# Patient Record
Sex: Male | Born: 1965 | Race: White | Hispanic: No | Marital: Single | Smoking: Never smoker
Health system: Southern US, Community
[De-identification: ages and names within clinical notes are randomized; demographics above are authoritative.]

## PROBLEM LIST (undated history)

## (undated) DIAGNOSIS — E871 Hypo-osmolality and hyponatremia: Secondary | ICD-10-CM

## (undated) DIAGNOSIS — F79 Unspecified intellectual disabilities: Secondary | ICD-10-CM

## (undated) DIAGNOSIS — E119 Type 2 diabetes mellitus without complications: Secondary | ICD-10-CM

## (undated) DIAGNOSIS — I10 Essential (primary) hypertension: Secondary | ICD-10-CM

## (undated) HISTORY — PX: OTHER SURGICAL HISTORY: SHX169

---

## 2012-11-04 ENCOUNTER — Inpatient Hospital Stay: Payer: Self-pay | Admitting: Internal Medicine

## 2012-11-04 LAB — CBC
HCT: 30.5 % — ABNORMAL LOW (ref 40.0–52.0)
MCHC: 33.7 g/dL (ref 32.0–36.0)
MCV: 76 fL — ABNORMAL LOW (ref 80–100)
Platelet: 279 10*3/uL (ref 150–440)
WBC: 4.7 10*3/uL (ref 3.8–10.6)

## 2012-11-04 LAB — COMPREHENSIVE METABOLIC PANEL
Albumin: 3.5 g/dL (ref 3.4–5.0)
Alkaline Phosphatase: 125 U/L (ref 50–136)
Anion Gap: 9 (ref 7–16)
BUN: 3 mg/dL — ABNORMAL LOW (ref 7–18)
Calcium, Total: 8.2 mg/dL — ABNORMAL LOW (ref 8.5–10.1)
Chloride: 82 mmol/L — ABNORMAL LOW (ref 98–107)
Co2: 25 mmol/L (ref 21–32)
Creatinine: 0.6 mg/dL (ref 0.60–1.30)
EGFR (African American): >60
EGFR (Non-African Amer.): >60
Glucose: 204 mg/dL — ABNORMAL HIGH (ref 65–99)
Osmolality: 237 (ref 275–301)
SGPT (ALT): 30 U/L (ref 12–78)
Sodium: 116 mmol/L — CL (ref 136–145)

## 2012-11-04 LAB — TROPONIN I: Troponin-I: <0.02 ng/mL

## 2012-11-04 LAB — CK TOTAL AND CKMB (NOT AT ARMC)
CK, Total: 190 U/L (ref 35–232)
CK-MB: 1.8 ng/mL (ref 0.5–3.6)

## 2012-11-04 LAB — URINALYSIS, COMPLETE
Bilirubin,UR: NEGATIVE
Ketone: NEGATIVE
Nitrite: NEGATIVE
Ph: 6 (ref 4.5–8.0)
Protein: NEGATIVE
RBC,UR: <1 /HPF (ref 0–5)
Specific Gravity: 1.001 (ref 1.003–1.030)
WBC UR: NONE SEEN /HPF (ref 0–5)

## 2012-11-05 LAB — LIPID PANEL
Cholesterol: 114 mg/dL (ref 0–200)
HDL Cholesterol: 24 mg/dL — ABNORMAL LOW (ref 40–60)
Ldl Cholesterol, Calc: 72 mg/dL (ref 0–100)
Triglycerides: 90 mg/dL (ref 0–200)

## 2012-11-05 LAB — CREATININE, URINE, RANDOM: Creatinine, Urine Random: 52.8 mg/dL (ref 30.0–125.0)

## 2012-11-05 LAB — BASIC METABOLIC PANEL
Anion Gap: 9 (ref 7–16)
Calcium, Total: 8.1 mg/dL — ABNORMAL LOW (ref 8.5–10.1)
Chloride: 105 mmol/L (ref 98–107)
Co2: 24 mmol/L (ref 21–32)
EGFR (African American): >60
Glucose: 186 mg/dL — ABNORMAL HIGH (ref 65–99)

## 2012-11-05 LAB — SODIUM, URINE, RANDOM: Sodium, Urine Random: 101 mmol/L (ref 20–110)

## 2012-11-06 LAB — SODIUM: Sodium: 135 mmol/L — ABNORMAL LOW (ref 136–145)

## 2012-11-08 LAB — SODIUM: Sodium: 133 mmol/L — ABNORMAL LOW (ref 136–145)

## 2012-11-19 LAB — COMPREHENSIVE METABOLIC PANEL
Albumin: 3.8 g/dL (ref 3.4–5.0)
Anion Gap: 9 (ref 7–16)
BUN: 4 mg/dL — ABNORMAL LOW (ref 7–18)
Bilirubin,Total: 0.4 mg/dL (ref 0.2–1.0)
Chloride: 94 mmol/L — ABNORMAL LOW (ref 98–107)
Co2: 26 mmol/L (ref 21–32)
Creatinine: 0.73 mg/dL (ref 0.60–1.30)
EGFR (African American): >60
EGFR (Non-African Amer.): >60
Glucose: 239 mg/dL — ABNORMAL HIGH (ref 65–99)
Osmolality: 264 (ref 275–301)
Potassium: 3.9 mmol/L (ref 3.5–5.1)
SGPT (ALT): 26 U/L (ref 12–78)
Sodium: 129 mmol/L — ABNORMAL LOW (ref 136–145)
Total Protein: 7.5 g/dL (ref 6.4–8.2)

## 2012-11-19 LAB — URINALYSIS, COMPLETE
Bacteria: NONE SEEN
Bilirubin,UR: NEGATIVE
Blood: NEGATIVE
Ketone: NEGATIVE
Leukocyte Esterase: NEGATIVE
Nitrite: NEGATIVE
Ph: 6 (ref 4.5–8.0)
Protein: NEGATIVE
RBC,UR: NONE SEEN /HPF (ref 0–5)
WBC UR: NONE SEEN /HPF (ref 0–5)

## 2012-11-19 LAB — CBC
HCT: 33.8 % — ABNORMAL LOW (ref 40.0–52.0)
MCV: 77 fL — ABNORMAL LOW (ref 80–100)
Platelet: 355 10*3/uL (ref 150–440)
RBC: 4.37 10*6/uL — ABNORMAL LOW (ref 4.40–5.90)
RDW: 16.3 % — ABNORMAL HIGH (ref 11.5–14.5)
WBC: 6 10*3/uL (ref 3.8–10.6)

## 2012-11-20 LAB — OSMOLALITY: Osmolality: 284 mOsm/kg (ref 275–295)

## 2012-11-20 LAB — OSMOLALITY, URINE: Osmolality: 139 mOsm/kg

## 2012-11-21 ENCOUNTER — Inpatient Hospital Stay: Payer: Self-pay | Admitting: Internal Medicine

## 2012-11-21 LAB — FERRITIN: Ferritin (ARMC): 5 ng/mL — ABNORMAL LOW

## 2012-11-21 LAB — SODIUM: Sodium: 141 mmol/L (ref 136–145)

## 2012-11-21 LAB — CBC WITH DIFFERENTIAL/PLATELET
Basophil #: 0.1 10*3/uL (ref 0.0–0.1)
Basophil %: 1.2 %
Eosinophil #: 0.1 10*3/uL (ref 0.0–0.7)
Eosinophil %: 1.9 %
HCT: 27.6 % — ABNORMAL LOW (ref 40.0–52.0)
Lymphocyte #: 1.1 10*3/uL (ref 1.0–3.6)
MCH: 26.1 pg (ref 26.0–34.0)
MCHC: 33.4 g/dL (ref 32.0–36.0)
MCV: 78 fL — ABNORMAL LOW (ref 80–100)
Monocyte #: 0.7 x10 3/mm (ref 0.2–1.0)
Neutrophil #: 2.5 10*3/uL (ref 1.4–6.5)
Neutrophil %: 55.6 %
RDW: 16.7 % — ABNORMAL HIGH (ref 11.5–14.5)

## 2012-11-21 LAB — IRON AND TIBC
Iron Bind.Cap.(Total): 393 ug/dL (ref 250–450)
Unbound Iron-Bind.Cap.: 368 ug/dL

## 2012-11-21 LAB — HEMOGLOBIN A1C: Hemoglobin A1C: 9.7 % — ABNORMAL HIGH

## 2012-11-23 LAB — HEMOGLOBIN: HGB: 9.7 g/dL — ABNORMAL LOW (ref 13.0–18.0)

## 2012-11-23 LAB — SODIUM: Sodium: 136 mmol/L (ref 136–145)

## 2012-11-23 LAB — CREATININE, SERUM: EGFR (African American): >60

## 2012-11-25 LAB — OSMOLALITY, URINE: Osmolality: 180 mOsm/kg

## 2012-11-25 LAB — SODIUM: Sodium: 133 mmol/L — ABNORMAL LOW (ref 136–145)

## 2014-09-29 NOTE — Consult Note (Signed)
Chief Complaint:  Subjective/Chief Complaint The patients stools were negative. Consent is a problem. Hb stable . Consider outpatint colonoscopy.   Brief Assessment:  GEN well developed   Respiratory normal resp effort   Lab Results: Routine Chem:  17-Jun-14 04:20   Sodium, Serum 136 (Result(s) reported on 23 Nov 2012 at 04:52AM.)  Creatinine (comp) 0.65  eGFR (African American) >60  eGFR (Non-African American) >60 (eGFR values <76m/min/1.73 m2 may be an indication of chronic kidney disease (CKD). Calculated eGFR is useful in patients with stable renal function. The eGFR calculation will not be reliable in acutely ill patients when serum creatinine is changing rapidly. It is not useful in  patients on dialysis. The eGFR calculation may not be applicable to patients at the low and high extremes of body sizes, pregnant women, and vegetarians.)  Routine Hem:  17-Jun-14 04:20   Hemoglobin (CBC)  9.7 (Result(s) reported on 23 Nov 2012 at 04:47AM.)   Assessment/Plan:  Assessment/Plan:  Assessment IDA   Plan The patent is heme negative. No urgency to have a colonoscopy. Will await concert for a colonoscopy. Consider as outpaient.   Electronic Signatures: WLucilla Lame(MD)  (Signed 17-Jun-14 14:53)  Authored: Chief Complaint, Brief Assessment, Lab Results, Assessment/Plan   Last Updated: 17-Jun-14 14:53 by WLucilla Lame(MD)

## 2014-09-29 NOTE — Consult Note (Signed)
PATIENT NAME:  Reginald Hall, Reginald Hall MR#:  [id number] DATE OF BIRTH:  [birth year]  DATE OF CONSULTATION:  11/22/2012  CONSULTING PHYSICIAN:  Midge Miniumarren Ilse Billman, MD  CONSULTING SERVICE: Gastroenterology.  REASON FOR CONSULTATION: Anemia.   HISTORY OF PRESENT ILLNESS: This patient is a 49 year old gentleman who comes in after being brought by police for being unable to care for himself. The patient was living with his mother who has recently been admitted for dementia. The patient was unable to take care of himself. He has a diagnosis of diabetes and from all accounts was unable to keep up with his medications and his up keeping. The patient was found on admission to have hemoglobin of 11.1 that went down to 9.2 as of yesterday morning. The patient's MCV was also low at 77. The patient is not able to give much of a history and suffers from being mentally disabled. The patient's iron studies showed his iron level to be low at 25 with iron saturation low at 6. I am now asked to see the patient for his anemia.   PAST MEDICAL HISTORY: Hypertension, diabetes mellitus, mentally disabled, psychogenic polydipsia.    ALLERGIES: No known drug allergies.   HOME MEDICATIONS: Losartan, Jentadueto.   SOCIAL HISTORY: He lives with his mother and does not smoke, drink or use illicit drugs.   FAMILY HISTORY: Noncontributory from the chart and also could not be obtained from the patient due to his inability to answer questions.   REVIEW OF SYSTEMS:  Could also not be obtained due to the patient's inability to convey the information.   PHYSICAL EXAM:   GENERAL: The patient sitting in his chair in no apparent distress, alert, not orientated.  Temperature 98.3, pulse 111, respirations 18, blood pressure 110/73, pulse oximetry 98%.   HEENT: Normocephalic, atraumatic. Extraocular movement intact. Pupils equally round and reactive to light and accommodation. Without JVD without lymphadenopathy.   LUNGS: Clear to auscultation  bilaterally.   HEART: Regular rate and rhythm without murmurs, rubs or gallops.   ABDOMEN: Soft, nontender, nondistended without hepatosplenomegaly.   EXTREMITIES: Without cyanosis, clubbing or edema.   NEUROLOGICAL: Grossly intact.   SKIN: Without any rashes or lesions.   ANCILLARY SERVICES: As stated above with his liver enzymes being normal and his blood work showed an iron deficiency anemia.   ASSESSMENT AND PLAN: This patient is a 49 year old gentleman with a history of mental disability who is now found to have anemia. The patient is unable to give any consent or understand what is going on at this present time. The patient's mother is also unable to make decisions for him. With his normal BUN and lower hemoglobin with a low MCV and low iron series, a colonoscopy would be the preferred test for him at this time.  I have spoken to social work who was trying to get the patient to be a ward of the state so that decisions can be made for him; at this time, there is no clear guardian to make his medical decisions except for an uncle who I am not sure will be long-term solution for this patient. I will continue to watch his blood count overnight and see if it drops tomorrow morning. If it does, then the patient will need to be set up for a colonoscopy. Thank you very much for involving me in the care of this patient. If you have any questions, please do not hesitate to call.   ____________________________ Midge Miniumarren Zunaira Lamy, MD dw:rw D: 11/22/2012 18:39:53 ET  T: 11/22/2012 19:59:50 ET JOB#: [id number]  cc: Midge Minium, MD, <Dictator> Midge Minium MD ELECTRONICALLY SIGNED 11/23/2012 15:36

## 2014-09-29 NOTE — Discharge Summary (Signed)
PATIENT NAME:  Reginald Hall, Reginald Hall MR#:  [id number] DATE OF BIRTH:  08/05/1965  DATE OF ADMISSION:  11/05/2012 DATE OF DISCHARGE:  11/08/2012  DISCHARGE DIAGNOSES:   1.  Hyponatremia, likely due to polydipsia, now improved with fluid restriction and IV fluids.  2.  Folliculitis. Started on bacitracin ointment, improving.    SECONDARY DIAGNOSES:   1.  Mental retardation.  2.  Diabetes.  3.  Hypertension.   CONSULTATIONS: None.   PROCEDURES AND RADIOLOGY: None  MAJOR LABORATORY PANEL: Urinalysis on admission was negative.   HISTORY AND SHORT HOSPITAL COURSE: The patient is a 49 year old male with above-mentioned medical problems, who was admitted for hyponatremia, thought to be secondary to polydipsia. Oral fluids were restricted. His sodium started improving and had been normalized within 24 hours. He was close to his baseline.  By June 2, his sodium was 160 on admission. Please see Dr. Jearl KlinefelterSona Patel's history and physical for further details. His sodium on of May 30  was 138.  On the date of discharge, his sodium was 133.  The patient was evaluated by [employer] and was recommended discharged back home. On the date of discharge, his vital signs were as follows: Temperature 97.8, heart rate 95 and respirations 20 per minute, blood pressure 125/83.  He is saturating 97% on room air.   PHYSICAL EXAMINATION ON THE DATE OF DISCHARGE:  CARDIOVASCULAR: S1, S2 normal. No murmurs, rubs or gallop.  LUNGS: Clear to auscultation bilaterally. No wheezing, rales, rhonchi or crepitation.  ABDOMEN: Soft, benign.  NEUROLOGIC: Nonfocal examination exam was limited secondary to his baseline mental retardation and understanding capacity.   All other physical examination remained at baseline.   DISCHARGE MEDICATIONS: 1.  Bacitracin topical twice a day to affected for 7 days.  2.  Losartan 50 mg p.o. daily.  3.  Jentadueto 2.5/5, 1 tablet b.i.d.   DISCHARGE DIET: 1800 ADA low sodium,   DISCHARGE  ACTIVITY: As tolerated.   DISCHARGE INSTRUCTIONS AND FOLLOW-UP: The patient was instructed to follow up with his primary care physician, Dr. Maryellen Hall, in 1 to 2 weeks.   He will need a basic metabolic panel check on 5th of June with hid primary doctor, Dr. Maryellen Hall for sodium monitoring.   TOTAL TIME DISCHARGING THIS PATIENT: 55 minutes.   ____________________________ Ellamae SiaVipul S. Sherryll BurgerShah, MD vss:cc D: 11/08/2012 16:49:44 ET T: 11/08/2012 21:45:08 ET JOB#: [id number]  cc: Kegan Shepardson S. Sherryll BurgerShah, MD, <Dictator> Serita ShellerErnest B. Reginald PileEason, MD Patricia PesaVIPUL S Natalea Sutliff MD ELECTRONICALLY SIGNED 11/10/2012 11:58

## 2014-09-29 NOTE — H&P (Signed)
PATIENT NAME:  Reginald Hall, Ying M MR#:  [id number] DATE OF BIRTH:  December 17, 1965  DATE OF ADMISSION:  11/04/2012  PRIMARY CARE PHYSICIAN: Dr. Maryellen PileEason.   CHIEF COMPLAINT: [place]   HISTORY OF PRESENT ILLNESS: Mr. Reginald Hall is a 49 year old Caucasian gentleman with history of mental retardation, hypertension and type 2 diabetes. Comes to the Emergency Room after neighbors found mother with altered mental status, covered with feces and urine all over the house and [place]. Police department and detectives were at the patient's home and notified DSS about the living conditions. They were brought here to the Emergency Room, and on workup, the patient's sodium was found to be 116. He appears alert, oriented and denies any complaints. Per other family members present in the Emergency Room, they tell the patient has been drinking a lot of sodas, juices and water at home. No complaints of abdominal pain, chest pain or shortness of breath. The patient is being admitted for further evaluation and management.   PAST MEDICAL HISTORY:  1. Mental retardation.  2. Diabetes.  3. Hypertension.   MEDICATIONS: Not known. The patient's [facility] [address]. Pharmacy tech or RN to call in the morning to get medication list.   ALLERGIES: No known drug allergies.   SOCIAL HISTORY: Lives at home with mother. Nonsmoker, nonalcoholic.   REVIEW OF SYSTEMS: Unobtainable secondary to mental retardation.   PHYSICAL EXAMINATION:  GENERAL: Awake, alert.  VITAL SIGNS: Pulse is 106. Blood pressure is 142/86. Sats are 99% on room air.  HEENT: Atraumatic, normocephalic. PERRLA. EOM intact. Oral mucosa is moist.  NECK: Supple. No JVD. No carotid bruits.  RESPIRATORY: Clear to auscultation bilaterally. No rales, rhonchi, respiratory distress or labored breathing.  CARDIOVASCULAR: Both heart sounds are normal. Rate and rhythm regular. PMI not  lateralized. Chest nontender.  EXTREMITIES: Good pedal pulses. Good femoral pulses. No lower extremity edema.  ABDOMEN: Soft, benign, nontender. No organomegaly. Positive bowel sounds.  NEUROLOGIC: No focal neuro deficits. Again, limited exam secondary to mental retardation and understanding capability.  SKIN: Warm and dry. He has a small rash over his mid scapula on the right.   LABORATORY DATA: UA negative for UTI. Cardiac enzymes, first set, negative. H abdomen H is 10.3 and 30.5. Platelet count is 279. MCV is 76. Glucose is 204. BUN is 3. Creatinine is 0.6. Sodium is 116. Chloride is 82. Calcium is 8.2. The rest of the electrolytes are normal.   ASSESSMENT AND PLAN: Mr. Reginald PontoJeffrey Demarco is a 49 year old with history of mental retardation, type 2 diabetes and hypertension. Brought in with:  1. Hyponatremia, acute versus chronic, appears latter more. Suspect due to polydipsia in the setting of mental retardation with drinking excessive amount of fluids at home. Will at this time admit the patient on 2A. Will give a regular diet. Will restrict p.o. fluids and give slow trickle of intravenous fluids. The patient appears euvolemic. Repeat sodium on daily basis. If no improvement, consider nephrology consultation.  2. Mental retardation.  3. Type 2 diabetes. Will do sliding scale for now. Will need to [facility] [address] and get medication list and resume p.o. home diabetic meds.  4. Hypertension, stable. Again, will call pharmacy in the morning and resume the patient's home medications once the list is available.   Further workup according to the patient's clinical course. Hospital admission plan was discussed with the patient and the patient's family  members.   TIME SPENT: 50 minutes.    ____________________________ Wylie Hail Allena Katz, MD sap:gb D: 11/04/2012 21:53:58 ET T: 11/04/2012 22:56:23 ET JOB#: [id number]  cc: Manjot Hinks A. Allena Katz, MD, <Dictator> Serita Sheller. Maryellen Pile, MD Willow Ora  MD ELECTRONICALLY SIGNED 11/11/2012 19:38

## 2014-09-29 NOTE — Discharge Summary (Signed)
PATIENT NAME:  Reginald Hall, Reginald Hall MR#:  [id number] DATE OF BIRTH:  1965/11/16  DATE OF ADMISSION:  11/20/2012 DATE OF DISCHARGE:  11/25/2012  ADMITTING DIAGNOSIS:  Hyponatremia.  DISCHARGE DIAGNOSES: 1.  Hyponatremia.  2.  Dehydration as well as psychogenic polydipsia related, now on fluid restrictions.  3.  Mental retardation.  4.  Diabetes mellitus with hemoglobin A1c of 9.7.  5.  Iron deficiency anemia.  6.  History of hypertension.   DISCHARGE INSTRUCTIONS:  The patient was advised to have his primary care physician get sodium level checked in approximately 1 week after discharge to ensure stability.   DISCHARGE MEDICATIONS: Will be as follows: 1.  Losartan 50 mg p.o. daily. 2.  Jentadueto 2.5/500 mg 1 tablet twice daily. 3.  Iron sulfate 325 mg p.o. twice daily. 4.  Pantoprazole 40 mg p.o. twice daily. 5.  Colace 100 mg p.o. twice daily as needed.   HOME OXYGEN: None.   DIET: Low sodium, 1800 ADA, carbohydrate-controlled diet, 1500 mL 24 hour fluid restriction, regular consistency.   ACTIVITY LIMITATIONS: As tolerated.  DISCHARGE FOLLOWUP:  Appointment with primary care physician to be established by DSS recommendations in 2 to 3 days after discharge.  CONSULTANTS: Social work, Herbalist.   RADIOLOGIC STUDIES: None.   HISTORY: The patient is a 49 year old Caucasian male with past medical history significant for history of hypertension, diabetes, mental retardation and psychogenic polydipsia who presented to the hospital when he was brought by police department and social services as the patient's family was not able to care for him.  Apparently the patient was diagnosed with diabetes and not getting his medications.    On arrival to the Emergency Room, the patient was noted to have low sodium level at 129 and was admitted to the hospital for further evaluation, also for placement. His vital signs on the day of admission showed temperature of 99.2, pulse 64, blood pressure  142/63, respiration rate was 18 and O2 sats were 98% on room air. Physical exam was unremarkable.   LABORATORY DATA:  Done on admission showed elevated glucose of 239 and sodium 129, otherwise BMP was unremarkable. The patient's liver enzymes were normal. White blood cell count was normal at 6.0, hemoglobin was 11.1 and platelet count was 355. MCV was low at 77. Urinalysis was unremarkable.   HOSPITAL COURSE:  The patient was in the hospital and started initially on IV fluids because of concern of dehydration-related hyponatremia. His hemoglobin A1c was checked and was found to be 9.7. His osmolarity was checked in blood and was found to be 284 and his urine osmolality was low at 139.  It was felt that the patient's hyponatremia could be related to psychogenic polydipsia; however, with rehydration, normal saline infusion, the patient's sodium level normalized so it was felt that hyponatremia could also be in part related to some element of dehydration as it responded to normal saline infusion.  The patient's normal saline was then stopped and the patient was allowed to drink and eat fluids unattended. With that the patient's sodium level again decreased to 133, on 11/25/2012. It was felt that the patient does have psychogenic polydipsia and fluid restriction was initiated upon discharge. It is recommended to follow the patient's sodium levels as outpatient every 1 week until stabilization is achieved.  Next, in regards to mental retardation, supportive therapy only was given for him while he was in the hospital. The patient had physical therapist consultation and was felt to be appropriate for discharge  to group home settings. [facility] patient and PPD was placed and was read as negative on 11/24/2012.   In regards to diabetes mellitus, as mentioned above, the patient's hemoglobin A1c was checked and was found to be 9.7. Initially, we started the patient on  insulin Lantus. However, the patient's blood glucose levels in the mornings were very low so insulin was completely discontinued. It was felt that the patient would benefit from restarting his usual home medications, which are Jentadueto 2.5/400 mg twice daily dose. It is recommended to follow the patient's glucose levels as outpatient and make decisions about advancement of his medications if needed.   The patient was noted to have iron deficiency anemia. His guaiac was checked and was found to be negative. Iron levels were low with level of 25 and iron saturation of only 6. Ferritin level was also low at 5.  Gastroenterology consultation was obtained and Dr. Servando SnareWohl saw the patient in consultation; however, due to inability to get consent, due to DSS involvement, all workup was postponed. The patient's hemoglobin remained stable. The patient was started on proton pump inhibitor, Protonix, and iron supplementation. The patient would benefit from outpatient evaluation consistent with EGD as well as colonoscopy by gastroenterologist, Dr. Servando SnareWohl, who saw the patient in consultation as outpatient as soon as  consent to perform those procedures are given by DSS.  The patient's hemoglobin level remained stable and on the day of discharge the patient's hemoglobin level remained around 9.7. It is recommended to follow, however, the patient's hemoglobin levels and make decisions about advancement of his iron supplementation oral or give IV if needed. Again no bleeding was noted.   For history of hypertension, the patient had blood pressure readings intermittently up and down.  We did not change any of his blood pressure medications as his blood pressure was well controlled and even relatively low while in the hospital. The patient is to continue losartan, however, it is recommended to check the patient's blood pressure readings as outpatient and make decision about lowering losartan dose if needed.  On the day of  discharge, 11/25/2012, the patient's temperature is 97.8, pulse was 71, respiratory rate was 18, blood pressure 92, ranging from 90s to 100s/50s to 60s and O2 sats were 95 to 97% on room air at rest.   TIME SPENT: 40 minutes.  ____________________________ Katharina Caperima Libra Gatz, MD rv:sb D: 11/25/2012 15:06:28 ET T: 11/25/2012 16:12:16 ET JOB#: [id number]  cc: Katharina Caperima Trista Ciocca, MD, <Dictator> Kawan Valladolid MD ELECTRONICALLY SIGNED 12/13/2012 12:39

## 2014-09-29 NOTE — H&P (Signed)
PATIENT NAME:  Reginald Hall, Reginald Hall MR#:  [id number] DATE OF BIRTH:  19-Apr-1966  DATE OF ADMISSION:  11/20/2012  PRIMARY CARE PHYSICIAN: Not mentioned here.   REFERRING PHYSICIAN: Rebecka Apley, MD   CHIEF COMPLAINT: The patient is brought in by the police department and social services for unable to care.   HISTORY OF PRESENT ILLNESS: Reginald Hall is a 49 year old white male with history of baseline mental retardation who lives with his mother, who was providing care, who has been forgetful. Was admitted to the hospital recently for hyponatraemia. The patient has not been taking his medications for the last 1 week. The patient has diagnosis of diabetes mellitus; however, has not taken his insulin in the last 1 week. The patient also had a refill of losartan which was filled on 10/29/2012, currently has empty bottle. Considering this, social services were involved and brought the patient to the hospital. Workup in the Emergency Department revealed the patient has low sodium of 129, glucose 239, the rest of all the values are within normal limits. No signs of any infection. The patient is unable to provide the history; however, stated that denies having any cough, shortness of breath, nausea, vomiting, abdominal pain. Reginald Hall had a similar admission in May 2014, when the patient was found to be in filthy conditions. The patient was found to have sodium of 116. This was felt to be from the psychogenic polydipsia. After correcting the sodium, the patient was discharged back home. Concerning about the patient's living conditions, the decision was made for possible placement of this patient.   PAST MEDICAL HISTORY:  1. Hypertension.  2. Diabetes mellitus, on oral medication.  3. Mental retardation.  4. Psychogenic polydipsia.   ALLERGIES: No known drug allergies.   HOME MEDICATIONS:  1. Losartan 50 mg once a day.  2. Jentadueto 2.5/500 mg 1 tablet 2 times a day.   SOCIAL HISTORY: He lives with his  mother. No history of smoking, drinking, alcohol or using illicit drugs.   FAMILY HISTORY: Could not be obtained from the patient considering the patient's underlying mental retardation.   REVIEW OF SYSTEMS: Could not be obtained secondary to the patient's mental retardation; however, stated that no cough, shortness of breath. No abdominal pain, nausea and vomiting.   PHYSICAL EXAMINATION:  GENERAL: This is a thin-built white male lying down in the bed, not in distress.  VITAL SIGNS: Temperature 99.2, pulse 64, blood pressure 142/63, respiratory rate of 18, oxygen saturation is 98% on room air.  HEENT: Head normocephalic, atraumatic. Eyes: No sclerae icterus. Conjunctivae normal. Pupils equal and reactive to light. Mucous membranes: Mild dryness.  NECK: Supple. No lymphadenopathy. No JVD. No carotid bruit.  CHEST: Has no focal tenderness.  LUNGS: Bilaterally clear to auscultation.  HEART: S1, S2 regular. No murmurs are heard.  ABDOMEN: Bowel sounds present. Soft, nontender, nondistended.  EXTREMITIES: No pedal edema. Pulses 2+.  NEUROLOGIC: The patient is oriented to self, but not place and time. No apparent cranial nerve abnormalities. Motor 5/5 in upper and lower extremities.   LABORATORY DATA: UA negative for nitrites and leukocyte esterase. CMP: Sodium 129, the rest of all the values are within normal limits. CBC: WBC of 6, hemoglobin 11, platelet count of 355. The rest of all the values are within normal limits, with glucose 238.   ASSESSMENT AND PLAN: Reginald Hall is a 49 year old male with baseline mental retardation, who comes to the Emergency Department as the patient was not getting care at home.  1. Hyponatremia. This could be secondary to psychogenic polydipsia. Keep the patient on fluid restriction of 1500 mL and follow up.  2. Diabetes mellitus, poorly controlled. Will obtain hemoglobin A1c and start back on home medications.  3. Hypertension, moderately controlled; however, will  continue with losartan.  4. Social conditions. The patient will benefit from being placed as the patient's mother has been forgetful, and the patient having underlying mental retardation. Will obtain social work consult. 5. Keep the patient on deep vein thrombosis prophylaxis with Lovenox.   TIME SPENT: 40 minutes.   ____________________________ Susa GriffinsPadmaja Jenisse Vullo, MD pv:OSi D: 11/20/2012 06:43:22 ET T: 11/20/2012 06:57:55 ET JOB#: [id number]  cc: Susa GriffinsPadmaja Mariona Scholes, MD, <Dictator> Susa GriffinsPADMAJA Stuti Sandin MD ELECTRONICALLY SIGNED 11/21/2012 8:07

## 2015-02-05 ENCOUNTER — Other Ambulatory Visit: Payer: Self-pay | Admitting: Student

## 2015-02-05 DIAGNOSIS — R634 Abnormal weight loss: Secondary | ICD-10-CM

## 2015-02-13 ENCOUNTER — Ambulatory Visit: Payer: Self-pay

## 2015-02-13 ENCOUNTER — Ambulatory Visit
Admission: RE | Admit: 2015-02-13 | Discharge: 2015-02-13 | Disposition: A | Discharge status: Home / Self Care | Payer: Medicare Other | Source: Ambulatory Visit | Attending: Student | Admitting: Student

## 2015-02-13 DIAGNOSIS — R634 Abnormal weight loss: Secondary | ICD-10-CM

## 2015-02-13 DIAGNOSIS — R911 Solitary pulmonary nodule: Secondary | ICD-10-CM | POA: Diagnosis not present

## 2015-02-13 DIAGNOSIS — Q453 Other congenital malformations of pancreas and pancreatic duct: Secondary | ICD-10-CM | POA: Insufficient documentation

## 2015-02-13 HISTORY — DX: Type 2 diabetes mellitus without complications: E11.9

## 2015-02-13 HISTORY — DX: Essential (primary) hypertension: I10

## 2015-02-13 MED ORDER — IOHEXOL 300 MG/ML  SOLN
85.0000 mL | Freq: Once | INTRAMUSCULAR | Status: AC | PRN
  Administered 2015-02-13: 85 mL via INTRAVENOUS

## 2015-03-22 ENCOUNTER — Encounter: Payer: Self-pay | Admitting: *Deleted

## 2015-03-23 ENCOUNTER — Encounter
Admission: RE | Disposition: A | Discharge status: Home / Self Care | Payer: Self-pay | Source: Ambulatory Visit | Attending: Gastroenterology

## 2015-03-23 ENCOUNTER — Ambulatory Visit: Payer: Medicare Other | Admitting: Anesthesiology

## 2015-03-23 ENCOUNTER — Encounter: Payer: Self-pay | Admitting: *Deleted

## 2015-03-23 ENCOUNTER — Ambulatory Visit
Admission: RE | Admit: 2015-03-23 | Discharge: 2015-03-23 | Disposition: A | Discharge status: Home / Self Care | Payer: Medicare Other | Source: Ambulatory Visit | Attending: Gastroenterology | Admitting: Gastroenterology

## 2015-03-23 DIAGNOSIS — K449 Diaphragmatic hernia without obstruction or gangrene: Secondary | ICD-10-CM | POA: Insufficient documentation

## 2015-03-23 DIAGNOSIS — K900 Celiac disease: Secondary | ICD-10-CM | POA: Insufficient documentation

## 2015-03-23 DIAGNOSIS — F79 Unspecified intellectual disabilities: Secondary | ICD-10-CM | POA: Diagnosis not present

## 2015-03-23 DIAGNOSIS — E119 Type 2 diabetes mellitus without complications: Secondary | ICD-10-CM | POA: Diagnosis not present

## 2015-03-23 DIAGNOSIS — R634 Abnormal weight loss: Secondary | ICD-10-CM | POA: Diagnosis present

## 2015-03-23 DIAGNOSIS — I10 Essential (primary) hypertension: Secondary | ICD-10-CM | POA: Diagnosis not present

## 2015-03-23 DIAGNOSIS — K2970 Gastritis, unspecified, without bleeding: Secondary | ICD-10-CM | POA: Diagnosis not present

## 2015-03-23 DIAGNOSIS — R2990 Unspecified symptoms and signs involving the nervous system: Secondary | ICD-10-CM | POA: Diagnosis not present

## 2015-03-23 DIAGNOSIS — K219 Gastro-esophageal reflux disease without esophagitis: Secondary | ICD-10-CM | POA: Insufficient documentation

## 2015-03-23 DIAGNOSIS — Z7984 Long term (current) use of oral hypoglycemic drugs: Secondary | ICD-10-CM | POA: Insufficient documentation

## 2015-03-23 DIAGNOSIS — Z681 Body mass index (BMI) 19 or less, adult: Secondary | ICD-10-CM | POA: Insufficient documentation

## 2015-03-23 DIAGNOSIS — Z79899 Other long term (current) drug therapy: Secondary | ICD-10-CM | POA: Diagnosis not present

## 2015-03-23 HISTORY — DX: Hypo-osmolality and hyponatremia: E87.1

## 2015-03-23 HISTORY — DX: Unspecified intellectual disabilities: F79

## 2015-03-23 HISTORY — PX: ESOPHAGOGASTRODUODENOSCOPY (EGD) WITH PROPOFOL: SHX5813

## 2015-03-23 SURGERY — ESOPHAGOGASTRODUODENOSCOPY (EGD) WITH PROPOFOL
Anesthesia: General

## 2015-03-23 MED ORDER — MIDAZOLAM HCL 2 MG/2ML IJ SOLN
INTRAMUSCULAR | Status: DC | PRN
  Administered 2015-03-23: 2 mg via INTRAVENOUS

## 2015-03-23 MED ORDER — LIDOCAINE HCL (CARDIAC) 20 MG/ML IV SOLN
INTRAVENOUS | Status: DC | PRN
  Administered 2015-03-23: 60 mg via INTRAVENOUS

## 2015-03-23 MED ORDER — PROPOFOL 500 MG/50ML IV EMUL
INTRAVENOUS | Status: DC | PRN
  Administered 2015-03-23: 100 ug/kg/min via INTRAVENOUS

## 2015-03-23 MED ORDER — SODIUM CHLORIDE 0.9 % IV SOLN
INTRAVENOUS | Status: DC
Start: 2015-03-23 — End: 2015-03-23
  Administered 2015-03-23: 1000 mL via INTRAVENOUS

## 2015-03-23 NOTE — Anesthesia Postprocedure Evaluation (Signed)
  Anesthesia Post-op Note  Patient: Reginald Hall  Procedure(s) Performed: Procedure(s): ESOPHAGOGASTRODUODENOSCOPY (EGD) WITH PROPOFOL (N/A)  Anesthesia type:General  Patient location: PACU  Post pain: Pain level controlled  Post assessment: Post-op Vital signs reviewed, Patient's Cardiovascular Status Stable, Respiratory Function Stable, Patent Airway and No signs of Nausea or vomiting  Post vital signs: Reviewed and stable  Last Vitals:  Filed Vitals:   03/23/15 1210  BP: 120/73  Pulse: 87  Temp:   Resp: 21    Level of consciousness: awake, alert  and patient cooperative  Complications: No apparent anesthesia complications

## 2015-03-23 NOTE — H&P (Signed)
  Primary Care Physician:  Derwood KaplanEason,  Ernest B, MD  Pre-Procedure History & Physical: HPI:  Reginald Hall is a 49 y.o. male is here for an endoscopy.   Past Medical History  Diagnosis Date  . Diabetes mellitus without complication [facility](HCC)     Patient takes Metformin  . Hypertension   . Mental retardation   . Hyponatremia     History reviewed. No pertinent past surgical history.  Prior to Admission medications   Medication Sig Start Date End Date Taking? Authorizing Provider  docusate sodium (COLACE) 100 MG capsule Take 100 mg by mouth 2 (two) times daily.   Yes Historical Provider, MD  ferrous sulfate 325 (65 FE) MG tablet Take 325 mg by mouth 2 (two) times daily with a meal.   Yes Historical Provider, MD  glimepiride (AMARYL) 4 MG tablet Take 4 mg by mouth daily with breakfast.   Yes Historical Provider, MD  hydrocortisone 2.5 % cream Apply 1 application topically 2 (two) times daily.   Yes Historical Provider, MD  losartan (COZAAR) 50 MG tablet Take 50 mg by mouth daily.   Yes Historical Provider, MD  metFORMIN (GLUCOPHAGE) 1000 MG tablet Take 1,000 mg by mouth 2 (two) times daily with a meal.   Yes Historical Provider, MD  pantoprazole (PROTONIX) 20 MG tablet Take 20 mg by mouth daily.   Yes Historical Provider, MD    Allergies as of 03/07/2015  . (No Known Allergies)    History reviewed. No pertinent family history.  Social History   Social History  . Marital Status: Single    Spouse Name: N/A  . Number of Children: N/A  . Years of Education: N/A   Occupational History  . Not on file.   Social History Main Topics  . Smoking status: Never Smoker   . Smokeless tobacco: Never Used  . Alcohol Use: No  . Drug Use: No  . Sexual Activity: Not on file   Other Topics Concern  . Not on file   Social History Narrative     Physical Exam: BP 147/79 mmHg  Pulse 91  Temp(Src) 97.7 F (36.5 C) (Oral)  Resp 18  Ht 5' (1.524 m)  Wt 43.999 kg (97 lb)  BMI 18.94 kg/m2   SpO2 97% General:   Alert,  pleasant and cooperative in NAD Head:  Normocephalic and atraumatic. Neck:  Supple; no masses or thyromegaly. Lungs:  Clear throughout to auscultation.    Heart:  Regular rate and rhythm. Abdomen:  Soft, nontender and nondistended. Normal bowel sounds, without guarding, and without rebound.   Neurologic:  Alert and  oriented;  grossly normal neurologically.  Impression/Plan: Reginald Hall is here for an endoscopy to be performed for positive celiac serologies and weight loss  Risks, benefits, limitations, and alternatives regarding  endoscopy have been reviewed with the patient.  Questions have been answered.  All parties agreeable.   Elnita MaxwellEIN, Adilenne Ashworth GORDON, MD  03/23/2015, 11:14 AM

## 2015-03-23 NOTE — Op Note (Signed)
[facility] Gastroenterology Patient Name: Reginald PontoJeffrey Rochelle Procedure Date: 03/23/2015 11:16 AM MRN: [id number] Account #: [id number] Date of Birth: 11/23/1965 Admit Type: Outpatient Age: [age] [facility] 1 Gender: Male Note Status: Finalized Procedure:         Upper GI endoscopy Indications:       Positive celiac serologies, Weight loss Patient Profile:   This is a 49 year old male. Providers:         Rhona RaiderMatthew G. Shelle Ironein, MD Referring MD:      Serita ShellerErnest B. Maryellen PileEason, MD (Referring MD) Medicines:         Propofol per Anesthesia Complications:     No immediate complications. Procedure:         Pre-Anesthesia Assessment:                    - Prior to the procedure, a History and Physical was                     performed, and patient medications, allergies and                     sensitivities were reviewed. The patient's tolerance of                     previous anesthesia was reviewed.                    After obtaining informed consent, the endoscope was passed                     under direct vision. Throughout the procedure, the                     patient's blood pressure, pulse, and oxygen saturations                     were monitored continuously. The Endoscope was introduced                     through the mouth, and advanced to the second part of                     duodenum. The upper GI endoscopy was accomplished without                     difficulty. The patient tolerated the procedure well. Findings:      The esophagus was normal.      A small hiatus hernia was present.      Diffuse mild inflammation characterized by erythema was found in the       gastric antrum.      Scalloped mucosa was found in the 2nd part of the duodenum. Biopsies for       histology were taken with a cold forceps for for evaluation of celiac       disease from the duod bulb and D2. Impression:        - Normal esophagus.                    - Small hiatus hernia.  - Gastritis.                    - Duodenal mucosal changes seen, suspicious for celiac  disease. Biopsied. Recommendation:    - Observe patient in GI recovery unit.                    - Resume regular diet.                    - Continue present medications.                    - Await pathology results.                    - Return to GI clinic. Procedure Code(s): --- Professional ---                    [id number], Esophagogastroduodenoscopy, flexible, transoral;                     with biopsy, single or multiple CPT copyright 2014 American Medical Association. All rights reserved. The codes documented in this report are preliminary and upon coder review may  be revised to meet current compliance requirements. Kathalene Frames, MD 03/23/2015 11:38:25 AM This report has been signed electronically. Number of Addenda: 0 Note Initiated On: 03/23/2015 11:16 AM      [facility]

## 2015-03-23 NOTE — Transfer of Care (Signed)
Immediate Anesthesia Transfer of Care Note  Patient: Reginald Hall  Procedure(s) Performed: Procedure(s): ESOPHAGOGASTRODUODENOSCOPY (EGD) WITH PROPOFOL (N/A)  Patient Location: PACU and Endoscopy Unit  Anesthesia Type:General  Level of Consciousness: sedated  Airway & Oxygen Therapy: Patient Spontanous Breathing and Patient connected to nasal cannula oxygen  Post-op Assessment: Report given to RN and Post -op Vital signs reviewed and stable  Post vital signs: Reviewed and stable  Last Vitals:  Filed Vitals:   03/23/15 1138  BP: 104/50  Pulse: 91  Temp:   Resp: 12    Complications: No apparent anesthesia complications

## 2015-03-23 NOTE — Anesthesia Preprocedure Evaluation (Signed)
Anesthesia Evaluation  Patient identified by MRN, date of birth, ID band Patient awake    Reviewed: Allergy & Precautions, NPO status , Patient's Chart, lab work & pertinent test results  History of Anesthesia Complications Negative for: history of anesthetic complications  Airway Mallampati: II       Dental  (+) Chipped   Pulmonary neg pulmonary ROS,           Cardiovascular hypertension, Pt. on medications      Neuro/Psych PSYCHIATRIC DISORDERS negative neurological ROS     GI/Hepatic Neg liver ROS, GERD  Medicated,  Endo/Other  diabetes, Type 2, Oral Hypoglycemic Agents  Renal/GU negative Renal ROS     Musculoskeletal   Abdominal   Peds  (+) mental retardation and Neurological problem Hematology negative hematology ROS (+)   Anesthesia Other Findings   Reproductive/Obstetrics                             Anesthesia Physical Anesthesia Plan  ASA: III  Anesthesia Plan: General   Post-op Pain Management:    Induction: Intravenous  Airway Management Planned: Nasal Cannula  Additional Equipment:   Intra-op Plan:   Post-operative Plan:   Informed Consent: I have reviewed the patients History and Physical, chart, labs and discussed the procedure including the risks, benefits and alternatives for the proposed anesthesia with the patient or authorized representative who has indicated his/her understanding and acceptance.     Plan Discussed with:   Anesthesia Plan Comments:         Anesthesia Quick Evaluation

## 2015-03-26 LAB — SURGICAL PATHOLOGY

## 2015-03-28 ENCOUNTER — Encounter: Payer: Self-pay | Admitting: Gastroenterology

## 2015-08-29 ENCOUNTER — Encounter: Payer: Self-pay | Admitting: General Surgery

## 2015-08-29 ENCOUNTER — Ambulatory Visit (INDEPENDENT_AMBULATORY_CARE_PROVIDER_SITE_OTHER): Payer: Medicare Other | Admitting: General Surgery

## 2015-08-29 VITALS — BP 116/74 | HR 74 | Resp 16 | Ht 59.0 in | Wt 93.0 lb

## 2015-08-29 DIAGNOSIS — L989 Disorder of the skin and subcutaneous tissue, unspecified: Secondary | ICD-10-CM | POA: Diagnosis not present

## 2015-08-29 MED ORDER — DOXYCYCLINE HYCLATE 100 MG PO CAPS: 100.0000 mg | ORAL_CAPSULE | Freq: Two times a day (BID) | ORAL | Status: DC

## 2015-08-29 NOTE — Patient Instructions (Signed)
Return for excision of skin lesion

## 2015-08-29 NOTE — Progress Notes (Signed)
Patient ID: Reginald Hall, male   DOB: 08/31/1965, 50 y.o.   MRN: [id number]  Chief Complaint  Patient presents with  . Other    abscess on right shoulder    HPI Reginald Hall is a 50 y.o. male here today for a evaluation of a abscess on left shoulder. Patient care giver states they noticed this area in January 2017. He had a incision and drainage on 07/09/15. The area is still red and swollen. Social services are legal guardian. Pt is accompanied by care giver.  I have reviewed the history of present illness with the patient.  HPI  Past Medical History  Diagnosis Date  . Diabetes mellitus without complication [facility](HCC)     Patient takes Metformin  . Hypertension   . Mental retardation   . Hyponatremia     Past Surgical History  Procedure Laterality Date  . Esophagogastroduodenoscopy (egd) with propofol N/A 03/23/2015    Procedure: ESOPHAGOGASTRODUODENOSCOPY (EGD) WITH PROPOFOL;  Surgeon: Elnita MaxwellMatthew Gordon Rein, MD;  [facility];  Service: Endoscopy;  Laterality: N/A;    History reviewed. No pertinent family history.  Social History Social History  Substance Use Topics  . Smoking status: Never Smoker   . Smokeless tobacco: Never Used  . Alcohol Use: 0.0 oz/week    0 Standard drinks or equivalent per week    No Known Allergies  Current Outpatient Prescriptions  Medication Sig Dispense Refill  . docusate sodium (COLACE) 100 MG capsule Take 100 mg by mouth 2 (two) times daily.    . ferrous sulfate 325 (65 FE) MG tablet Take 325 mg by mouth 2 (two) times daily with a meal.    . glimepiride (AMARYL) 4 MG tablet Take 4 mg by mouth daily with breakfast.    . losartan (COZAAR) 50 MG tablet Take 50 mg by mouth daily.    . metFORMIN (GLUCOPHAGE) 1000 MG tablet Take 1,000 mg by mouth 2 (two) times daily with a meal.    . pantoprazole (PROTONIX) 20 MG tablet Take 20 mg by mouth daily.    [name]. doxycycline (VIBRAMYCIN) 100 MG capsule Take 1 capsule (100 mg total) by mouth 2 (two)  times daily. 20 capsule 0  . hydrocortisone 2.5 % cream Apply 1 application topically 2 (two) times daily. Reported on 08/29/2015     No current facility-administered medications for this visit.    Review of Systems Review of Systems  Constitutional: Negative.   Respiratory: Negative.   Cardiovascular: Negative.     Blood pressure 116/74, pulse 74, resp. rate 16, height 4\' 11"  (1.499 m), weight 93 lb (42.185 kg).  Physical Exam Physical Exam  Constitutional: He appears well-developed and well-nourished.  Neck: Neck supple. No thyromegaly present.  Cardiovascular: Normal rate, regular rhythm and normal heart sounds.   Pulmonary/Chest: Effort normal and breath sounds normal.    Lymphadenopathy:    He has no cervical adenopathy.    He has no axillary adenopathy.  Neurological: He is alert.  Skin: Skin is warm and dry.    Data Reviewed Notes reviewed.   Assessment    Scabbed, blackened ulcerated area on upper back. Surrounding skin is erythematous. This does not appear to be a cyst with infection. Appears more like a non-healing skin lesion and is best dealt with by excision. This can be done in office location under local anesthetic    Plan   The above impression and recommendation were discussed with the patient his caregiver accompanying him today and also with  Ms. Melynda Keller from social services who has patient's power of attorney. She has agreed to let him have this procedure which will be done in 2 weeks after he completes a course of doxycycline Doxycycline  prescribed, 2 tablets BID for 7-10 days.  Proposed: excision of ulcerated area  Reason: remove suspicious skin ulcer Procedure is minor with minimal risks involved. If the lesion is unattended at this time, may continue to grow assuming this is a malignant lesion.       PCP:  Toy Cookey B This information has been scribed by Ples Specter CMA.   SANKAR,SEEPLAPUTHUR G 08/29/2015, 2:44 PM

## 2015-09-17 ENCOUNTER — Ambulatory Visit (INDEPENDENT_AMBULATORY_CARE_PROVIDER_SITE_OTHER): Payer: Medicare Other | Admitting: General Surgery

## 2015-09-17 ENCOUNTER — Encounter: Payer: Self-pay | Admitting: General Surgery

## 2015-09-17 VITALS — BP 110/68 | HR 88 | Resp 12 | Ht 60.0 in | Wt 93.0 lb

## 2015-09-17 DIAGNOSIS — L98499 Non-pressure chronic ulcer of skin of other sites with unspecified severity: Secondary | ICD-10-CM

## 2015-09-17 DIAGNOSIS — L928 Other granulomatous disorders of the skin and subcutaneous tissue: Secondary | ICD-10-CM

## 2015-09-17 NOTE — Patient Instructions (Signed)
Return in ten days.  

## 2015-09-17 NOTE — Progress Notes (Signed)
This is a [age] year male here today for excision right shoulder skin lesion. No new problems. I have reviewed the history of present illness with the patient.  Procedure note  Procedure: Excision skin lesion right upper back, intermediate closure  Anesthetic: 8 ml of 1% xylocaine mixed with 0.5% marcaine  Prep: chloro prep  Description: After local anesthetic and prep area was draped out. A vertical elliptical incision around the ulcerated skin lesion was made. The lesion was fully excised out. Upper end was tagged for orientation. Tissue sent to pathology. 2 bleeders required suture ligatures of 3-0 vicryl. Other bleeders were cauterized. Closure- 3-0 Vicryl in deeper tissue. Skin closed with interrupted vertical mattress stitches of 3-0 Nylon. Dressing: neosporin, telfa, gauze and tegaderm. No immediate problems from procedure.  Caretaker was advised on wound care. Rx- Tramadol 50 mg # 10, one po q6h prn   Return in 10-14 days for suture removal.      PCP:  Maryellen PileEason, This information has been scribed by Ples SpecterJessica Qualls CMA.

## 2015-09-20 ENCOUNTER — Telehealth: Payer: Self-pay | Admitting: *Deleted

## 2015-09-20 NOTE — Telephone Encounter (Signed)
-----   Message from Kieth BrightlySeeplaputhur G Sankar, MD sent at 09/20/2015  7:55 AM EDT ----- Mindi JunkerMarsha please let pt pt know the pathology was normal.Would like to see him in 2mos.

## 2015-09-20 NOTE — Telephone Encounter (Signed)
Notified patient as instructed, patient pleased. Discussed follow-up appointments, patient agrees  

## 2015-09-27 ENCOUNTER — Ambulatory Visit (INDEPENDENT_AMBULATORY_CARE_PROVIDER_SITE_OTHER): Payer: Medicare Other | Admitting: *Deleted

## 2015-09-27 DIAGNOSIS — L989 Disorder of the skin and subcutaneous tissue, unspecified: Secondary | ICD-10-CM

## 2015-09-27 NOTE — Patient Instructions (Addendum)
The patient is aware to call back for any questions or concerns. INFLAMED ULCER WITH FAT NECROSIS,

## 2015-09-27 NOTE — Progress Notes (Signed)
Patient came in today for a wound check/suture removal.  The wound is clean, with no signs of infection noted. Follow up as scheduled. 

## 2015-11-22 ENCOUNTER — Ambulatory Visit: Payer: Medicare Other | Admitting: General Surgery

## 2015-11-22 ENCOUNTER — Encounter: Payer: Self-pay | Admitting: *Deleted

## 2015-11-28 ENCOUNTER — Ambulatory Visit: Payer: Medicare Other | Admitting: General Surgery

## 2015-11-28 ENCOUNTER — Ambulatory Visit (INDEPENDENT_AMBULATORY_CARE_PROVIDER_SITE_OTHER): Payer: Medicare Other | Admitting: General Surgery

## 2015-11-28 ENCOUNTER — Encounter: Payer: Self-pay | Admitting: General Surgery

## 2015-11-28 VITALS — BP 116/74 | HR 74 | Resp 14 | Ht 59.0 in | Wt 88.4 lb

## 2015-11-28 DIAGNOSIS — L98499 Non-pressure chronic ulcer of skin of other sites with unspecified severity: Secondary | ICD-10-CM | POA: Diagnosis not present

## 2015-11-28 NOTE — Patient Instructions (Signed)

## 2015-11-28 NOTE — Progress Notes (Signed)
Patient ID: Morton StallJeffrey M Smotherman, male   DOB: 04/29/1966, 50 y.o.   MRN: [id number]  Here following up from a right shoulder skin ulcer excision done on 09/17/15. He reports that the area is doing well. He is here today with Haywood LassoLynette a worker at his retirement home. He reports that he may have a right inguinal hernia and has been bothering him this week.  Associates with pain to palpation. Dr Maryellen PileEason. noted this at a physical on 11/19/15. I have reviewed the history of present illness with the patient.   Incision site on back of upper right shoulder is well healed. No erythema noted. Path report was fat necrosis and ulceration.   Right sided inguinal hernia with Valsalva on physical exam. New finding, became apparent in last 3-4 weeks.  Explained that the procedure would need to be done in the OR and proper authorization and medical clearance would need to be obtained.  Will discuss with Dr. Maryellen PileEason ans pt's POA   This has been scribed by Sinda Duaryl-Lyn M Kennedy LPN  PCP: Toy CookeyEason, Ernest

## 2016-02-09 ENCOUNTER — Ambulatory Visit
Admission: EM | Admit: 2016-02-09 | Discharge: 2016-02-09 | Disposition: A | Discharge status: Home / Self Care | Payer: Medicare Other | Attending: Family Medicine | Admitting: Family Medicine

## 2016-02-09 ENCOUNTER — Encounter: Payer: Self-pay | Admitting: Gynecology

## 2016-02-09 DIAGNOSIS — L98491 Non-pressure chronic ulcer of skin of other sites limited to breakdown of skin: Secondary | ICD-10-CM

## 2016-02-09 MED ORDER — DOXYCYCLINE HYCLATE 100 MG PO TABS: 100.0000 mg | ORAL_TABLET | Freq: Two times a day (BID) | ORAL | 0 refills | Status: DC

## 2016-02-09 NOTE — ED Triage Notes (Signed)
Per caregiver patient with right arm infection. Patient has a rash/pimple on his right arm that looks infected.

## 2016-02-20 NOTE — ED Provider Notes (Signed)
[facility]    CSN: [id number] Arrival date & time: 02/09/16  1527  First Provider Contact:  None       History   Chief Complaint Chief Complaint  Patient presents with  . Rash    HPI Reginald Hall is a 50 y.o. male.   The history is provided by a caregiver.   Per caregiver patient with right arm infection. Patient has a rash/pimple on his right arm that looks infected. (as above history per RN verified).  Noticed skin lesion today. Denies any fevers, chills. Patient with cognitive disability, is diabetic.   Past Medical History:  Diagnosis Date  . Diabetes mellitus without complication [facility](HCC)    Patient takes Metformin  . Hypertension   . Hyponatremia   . Mental retardation     There are no active problems to display for this patient.   Past Surgical History:  Procedure Laterality Date  . ESOPHAGOGASTRODUODENOSCOPY (EGD) WITH PROPOFOL N/A 03/23/2015   Procedure: ESOPHAGOGASTRODUODENOSCOPY (EGD) WITH PROPOFOL;  Surgeon: Elnita MaxwellMatthew Gordon Rein, MD;  [facility];  Service: Endoscopy;  Laterality: N/A;       Home Medications    Prior to Admission medications   Medication Sig Start Date End Date Taking? Authorizing Provider  docusate sodium (COLACE) 100 MG capsule Take 100 mg by mouth 2 (two) times daily.   Yes Historical Provider, MD  ferrous sulfate 325 (65 FE) MG tablet Take 325 mg by mouth 2 (two) times daily with a meal.   Yes Historical Provider, MD  glimepiride (AMARYL) 4 MG tablet Take 4 mg by mouth daily with breakfast.   Yes Historical Provider, MD  losartan (COZAAR) 50 MG tablet Take 50 mg by mouth daily.   Yes Historical Provider, MD  metFORMIN (GLUCOPHAGE) 1000 MG tablet Take 1,000 mg by mouth 2 (two) times daily with a meal.   Yes Historical Provider, MD  pantoprazole (PROTONIX) 20 MG tablet Take 20 mg by mouth daily.   Yes Historical Provider, MD  doxycycline (VIBRA-TABS) 100 MG tablet Take 1 tablet (100 mg total) by mouth 2  (two) times daily. 02/09/16   Payton Mccallumrlando Shayra Anton, MD    Family History No family history on file.  Social History Social History  Substance Use Topics  . Smoking status: Never Smoker  . Smokeless tobacco: Never Used  . Alcohol use 0.0 oz/week     Allergies   Review of patient's allergies indicates no known allergies.   Review of Systems Review of Systems   Physical Exam Triage Vital Signs ED Triage Vitals  Enc Vitals Group     BP 02/09/16 1639 131/74     Pulse Rate 02/09/16 1639 75     Resp --      Temp 02/09/16 1639 (!) 96 F (35.6 C)     Temp Source 02/09/16 1639 Tympanic     SpO2 02/09/16 1639 100 %     Weight 02/09/16 1645 90 lb (40.8 kg)     Height --      Head Circumference --      Peak Flow --      Pain Score --      Pain Loc --      Pain Edu? --      Excl. in GC? --    No data found.   Updated Vital Signs BP 131/74 (BP Location: Left Arm)   Pulse 75   Temp (!) 96 F (35.6 C) (Tympanic)   Wt 90 lb (40.8 kg)  SpO2 100%   BMI 18.18 kg/m   Visual Acuity Right Eye Distance:   Left Eye Distance:   Bilateral Distance:    Right Eye Near:   Left Eye Near:    Bilateral Near:     Physical Exam  Constitutional: He appears well-developed and well-nourished. No distress.  Skin: He is not diaphoretic.  Approximately 1.5cm superficial skin ulceration with mild surrounding blanchable erythema. mildly tender on right arm;  no drainage  Nursing note and vitals reviewed.    UC Treatments / Results  Labs (all labs ordered are listed, but only abnormal results are displayed) Labs Reviewed - No data to display  EKG  EKG Interpretation None       Radiology No results found.  Procedures Procedures (including critical care time)  Medications Ordered in UC Medications - No data to display   Initial Impression / Assessment and Plan / UC Course  I have reviewed the triage vital signs and the nursing notes.  Pertinent labs & imaging results that  were available during my care of the patient were reviewed by me and considered in my medical decision making (see chart for details).  Clinical Course      Final Clinical Impressions(s) / UC Diagnoses   Final diagnoses:  Skin ulcer of upper arm, limited to breakdown of skin [facility])    New Prescriptions Discharge Medication List as of 02/09/2016  5:04 PM     1. diagnosis reviewed with caregiver 2. rx as per orders above; reviewed possible side effects, interactions, risks and benefits  3. Keep skin area clean 4. Follow-up prn if symptoms worsen or don't improve    Payton Mccallum, MD 02/20/16 1143

## 2016-02-28 ENCOUNTER — Ambulatory Visit (INDEPENDENT_AMBULATORY_CARE_PROVIDER_SITE_OTHER): Payer: Medicare Other | Admitting: General Surgery

## 2016-02-28 ENCOUNTER — Encounter: Payer: Self-pay | Admitting: General Surgery

## 2016-02-28 VITALS — BP 120/78 | HR 76 | Resp 14 | Ht 59.0 in | Wt 90.0 lb

## 2016-02-28 DIAGNOSIS — L989 Disorder of the skin and subcutaneous tissue, unspecified: Secondary | ICD-10-CM | POA: Diagnosis not present

## 2016-02-28 NOTE — Patient Instructions (Addendum)
The patient is aware to call back for any questions or concerns.Patient to use a triple antibiotic ointment daily with a bandage and return in three weeks.

## 2016-02-28 NOTE — Progress Notes (Signed)
Patient ID: Reginald Hall [place], male   DOB: 07/14/1965, 50 y.o.   MRN: [id number]  Chief Complaint  Patient presents with  . Follow-up    HPI Reginald Hall Coulthard is a 50 y.o. male. Here today for a boil on left arm and two boils on back. Just finished taking antibiotics. No drainage.  I have reviewed the history of present illness with the patient.  HPI  Past Medical History:  Diagnosis Date  . Diabetes mellitus without complication [facility](HCC)    Patient takes Metformin  . Hypertension   . Hyponatremia   . Mental retardation     Past Surgical History:  Procedure Laterality Date  . ESOPHAGOGASTRODUODENOSCOPY (EGD) WITH PROPOFOL N/A 03/23/2015   Procedure: ESOPHAGOGASTRODUODENOSCOPY (EGD) WITH PROPOFOL;  Surgeon: Elnita MaxwellMatthew Gordon Rein, MD;  [facility];  Service: Endoscopy;  Laterality: N/A;    No family history on file.  Social History Social History  Substance Use Topics  . Smoking status: Never Smoker  . Smokeless tobacco: Never Used  . Alcohol use 0.0 oz/week    No Known Allergies  Current Outpatient Prescriptions  Medication Sig Dispense Refill  . docusate sodium (COLACE) 100 MG capsule Take 100 mg by mouth 2 (two) times daily.    . ferrous sulfate 325 (65 FE) MG tablet Take 325 mg by mouth 2 (two) times daily with a meal.    . glimepiride (AMARYL) 4 MG tablet Take 4 mg by mouth daily with breakfast.    . losartan (COZAAR) 50 MG tablet Take 50 mg by mouth daily.    . metFORMIN (GLUCOPHAGE) 1000 MG tablet Take 1,000 mg by mouth 2 (two) times daily with a meal.    . pantoprazole (PROTONIX) 20 MG tablet Take 20 mg by mouth daily.     No current facility-administered medications for this visit.     Review of Systems Review of Systems  Constitutional: Negative.   Respiratory: Negative.   Cardiovascular: Negative.     Blood pressure 120/78, pulse 76, resp. rate 14, height 4\' 11"  (1.499 Hall), weight 90 lb (40.8 kg).  Physical Exam Physical Exam  Constitutional: He  is oriented to person, place, and time. He appears well-developed and well-nourished.  Eyes: Conjunctivae are normal.  Neck: Neck supple.  Cardiovascular: Normal rate and regular rhythm.   Lymphadenopathy:    He has no cervical adenopathy.  Neurological: He is alert and oriented to person, place, and time.  Skin: Skin is warm and dry.  Multiple small scabbed areas to bilateral upper extremities and upper back, about 0.5 cm in size with a surrounding area of erythema.     Data Reviewed  None Assessment    Findings suggest insect bites with local inflammation    Plan       Patient to use a triple antibiotic ointment daily with a bandage and return in three weeks.   This information has been scribed by Ples SpecterJessica Qualls CMA.    SANKAR,SEEPLAPUTHUR G 03/04/2016, 10:01 AM

## 2016-03-04 ENCOUNTER — Encounter: Payer: Self-pay | Admitting: General Surgery

## 2016-03-25 ENCOUNTER — Ambulatory Visit (INDEPENDENT_AMBULATORY_CARE_PROVIDER_SITE_OTHER): Payer: Medicare Other | Admitting: General Surgery

## 2016-03-25 ENCOUNTER — Encounter: Payer: Self-pay | Admitting: General Surgery

## 2016-03-25 VITALS — BP 116/78 | HR 76 | Resp 12 | Ht 59.0 in | Wt 93.0 lb

## 2016-03-25 DIAGNOSIS — K4090 Unilateral inguinal hernia, without obstruction or gangrene, not specified as recurrent: Secondary | ICD-10-CM

## 2016-03-25 DIAGNOSIS — L989 Disorder of the skin and subcutaneous tissue, unspecified: Secondary | ICD-10-CM | POA: Diagnosis not present

## 2016-03-25 MED ORDER — THERA-DERM EX LOTN: TOPICAL_LOTION | CUTANEOUS | Status: AC | PRN

## 2016-03-25 NOTE — Progress Notes (Signed)
Patient ID: Reginald Hall, male   DOB: 04/09/1966, 50 y.o.   MRN: [id number]  Chief Complaint  Patient presents with  . Follow-up    skin lesion    HPI Reginald Hall is a 50 y.o. male here today for his follow up regarding skin lesions on his back. He states the areas are gone. No new skin problems. He has a right inguinal hernia which is bothersome, but he has not voiced any complaints until today.  I have reviewed the history of present illness with the patient.  .HPI  Past Medical History:  Diagnosis Date  . Diabetes mellitus without complication (HCC)    Patient takes Metformin  . Hypertension   . Hyponatremia   . Mental retardation     Past Surgical History:  Procedure Laterality Date  . ESOPHAGOGASTRODUODENOSCOPY (EGD) WITH PROPOFOL N/A 03/23/2015   Procedure: ESOPHAGOGASTRODUODENOSCOPY (EGD) WITH PROPOFOL;  Surgeon: [name], MD;  Location: [facility] ENDOSCOPY;  Service: Endoscopy;  Laterality: N/A;    No family history on file.  Social History Social History  Substance Use Topics  . Smoking status: Never Smoker  . Smokeless tobacco: Never Used  . Alcohol use 0.0 oz/week    No Known Allergies  Current Outpatient Prescriptions  Medication Sig Dispense Refill  . docusate sodium (COLACE) 100 MG capsule Take 100 mg by mouth 2 (two) times daily.    . ferrous sulfate 325 (65 FE) MG tablet Take 325 mg by mouth 2 (two) times daily with a meal.    . glimepiride (AMARYL) 4 MG tablet Take 4 mg by mouth daily with breakfast.    . losartan (COZAAR) 50 MG tablet Take 50 mg by mouth daily.    . metFORMIN (GLUCOPHAGE) 1000 MG tablet Take 1,000 mg by mouth 2 (two) times daily with a meal.    . pantoprazole (PROTONIX) 20 MG tablet Take 20 mg by mouth daily.     Current Facility-Administered Medications  Medication Dose Route Frequency Provider Last Rate Last Dose  . lanolin/mineral oil (KERI/THERA-DERM) lotion   Topical PRN Seeplaputhur G Sankar, MD        Review of  Systems Review of Systems  Constitutional: Negative.   Respiratory: Negative.   Cardiovascular: Negative.     Blood pressure 116/78, pulse 76, resp. rate 12, height 4' 11" (1.499 m), weight 93 lb (42.2 kg).  Physical Exam Physical Exam  Constitutional: He is oriented to person, place, and time. He appears well-developed and well-nourished.  Eyes: Conjunctivae are normal. No scleral icterus.  Neck: Neck supple.  Cardiovascular: Normal rate, regular rhythm and normal heart sounds.   Pulmonary/Chest: Effort normal and breath sounds normal.  Abdominal: Soft. Normal appearance and bowel sounds are normal. He exhibits no distension. A hernia is present. Hernia confirmed positive in the right inguinal area ( Medium-large size. Reducible but recurs easily ).  Genitourinary:     Lymphadenopathy:    He has no cervical adenopathy.  Neurological: He is alert and oriented to person, place, and time.  Skin: Skin is warm and dry.  All skin lesions on his back have healed fully. The right inguinal hernia feels to have enlarged since last seen several weeks ago. Data Reviewed Previous note  Assessment    Skin lesions- no longer present Right inguinal hernia, enlarging, per pt this is painful at times    Plan    Talked with Dr. Eason, pt's PCP. He is felt to be medically stable to have the repair done.   Will contact pt's POA for consent. Pt and his caregiver seem agreeable to this Hernia precautions and incarceration were discussed with the patient. If he develops symptoms of an incarcerated hernia, they were encouraged to seek prompt medical attention.  I have recommended repair of the hernia using mesh on an outpatient basis in the near future. The risk of infection was reviewed. The role of prosthetic mesh to minimize the risk of recurrence was reviewed..  This information has been scribed by Ples SpecterJessica Qualls CMA.        SANKAR,SEEPLAPUTHUR G 03/25/2016, 3:01 PM

## 2016-03-25 NOTE — Patient Instructions (Addendum)
Patient to return as needed.  Inguinal Hernia, Adult Muscles help keep everything in the body in its proper place. But if a weak spot in the muscles develops, something can poke through. That is called a hernia. When this happens in the lower part of the belly (abdomen), it is called an inguinal hernia. (It takes its name from a part of the body in this region called the inguinal canal.) A weak spot in the wall of muscles lets some fat or part of the small intestine bulge through. An inguinal hernia can develop at any age. Men get them more often than women. CAUSES  In adults, an inguinal hernia develops over time.  It can be triggered by:  Suddenly straining the muscles of the lower abdomen.  Lifting heavy objects.  Straining to have a bowel movement. Difficult bowel movements (constipation) can lead to this.  Constant coughing. This may be caused by smoking or lung disease.  Being overweight.  Being pregnant.  Working at a job that requires long periods of standing or heavy lifting.  Having had an inguinal hernia before. One type can be an emergency situation. It is called a strangulated inguinal hernia. It develops if part of the small intestine slips through the weak spot and cannot get back into the abdomen. The blood supply can be cut off. If that happens, part of the intestine may die. This situation requires emergency surgery. SYMPTOMS  Often, a small inguinal hernia has no symptoms. It is found when a healthcare provider does a physical exam. Larger hernias usually have symptoms.   In adults, symptoms may include:  A lump in the groin. This is easier to see when the person is standing. It might disappear when lying down.  In men, a lump in the scrotum.  Pain or burning in the groin. This occurs especially when lifting, straining or coughing.  A dull ache or feeling of pressure in the groin.  Signs of a strangulated hernia can include:  A bulge in the groin that becomes  very painful and tender to the touch.  A bulge that turns red or purple.  Fever, nausea and vomiting.  Inability to have a bowel movement or to pass gas. DIAGNOSIS  To decide if you have an inguinal hernia, a healthcare provider will probably do a physical examination.  This will include asking questions about any symptoms you have noticed.  The healthcare provider might feel the groin area and ask you to cough. If an inguinal hernia is felt, the healthcare provider may try to slide it back into the abdomen.  Usually no other tests are needed. TREATMENT  Treatments can vary. The size of the hernia makes a difference. Options include:  Watchful waiting. This is often suggested if the hernia is small and you have had no symptoms.  No medical procedure will be done unless symptoms develop.  You will need to watch closely for symptoms. If any occur, contact your healthcare provider right away.  Surgery. This is used if the hernia is larger or you have symptoms.  Open surgery. This is usually an outpatient procedure (you will not stay overnight in a hospital). An cut (incision) is made through the skin in the groin. The hernia is put back inside the abdomen. The weak area in the muscles is then repaired by herniorrhaphy or hernioplasty. Herniorrhaphy: in this type of surgery, the weak muscles are sewn back together. Hernioplasty: a patch or mesh is used to close the weak area  in the abdominal wall.  Laparoscopy. In this procedure, a surgeon makes small incisions. A thin tube with a tiny video camera (called a laparoscope) is put into the abdomen. The surgeon repairs the hernia with mesh by looking with the video camera and using two long instruments. HOME CARE INSTRUCTIONS   After surgery to repair an inguinal hernia:  You will need to take pain medicine prescribed by your healthcare provider. Follow all directions carefully.  You will need to take care of the wound from the  incision.  Your activity will be restricted for awhile. This will probably include no heavy lifting for several weeks. You also should not do anything too active for a few weeks. When you can return to work will depend on the type of job that you have.  During "watchful waiting" periods, you should:  Maintain a healthy weight.  Eat a diet high in fiber (fruits, vegetables and whole grains).  Drink plenty of fluids to avoid constipation. This means drinking enough water and other liquids to keep your urine clear or pale yellow.  Do not lift heavy objects.  Do not stand for long periods of time.  Quit smoking. This should keep you from developing a frequent cough. SEEK MEDICAL CARE IF:   A bulge develops in your groin area.  You feel pain, a burning sensation or pressure in the groin. This might be worse if you are lifting or straining.  You develop a fever of more than 100.5 F (38.1 C). SEEK IMMEDIATE MEDICAL CARE IF:   Pain in the groin increases suddenly.  A bulge in the groin gets bigger suddenly and does not go down.  For men, there is sudden pain in the scrotum. Or, the size of the scrotum increases.  A bulge in the groin area becomes red or purple and is painful to touch.  You have nausea or vomiting that does not go away.  You feel your heart beating much faster than normal.  You cannot have a bowel movement or pass gas.  You develop a fever of more than 102.0 F (38.9 C).   This information is not intended to replace advice given to you by your health care provider. Make sure you discuss any questions you have with your health care provider.   Document Released: 10/12/2008 Document Revised: 08/18/2011 Document Reviewed: 11/27/2014 Elsevier Interactive Patient Education [employer]

## 2016-03-27 ENCOUNTER — Telehealth: Payer: Self-pay | Admitting: *Deleted

## 2016-03-27 MED ORDER — THERA-DERM EX LOTN: 1.0000 "application " | TOPICAL_LOTION | CUTANEOUS | 6 refills | Status: DC | PRN

## 2016-03-27 NOTE — Telephone Encounter (Signed)
Phone call states RX was never sent to the pharmacy, new RX sent.

## 2016-04-02 ENCOUNTER — Encounter: Payer: Self-pay | Admitting: *Deleted

## 2016-04-02 ENCOUNTER — Other Ambulatory Visit: Payer: Self-pay | Admitting: General Surgery

## 2016-04-02 DIAGNOSIS — K4090 Unilateral inguinal hernia, without obstruction or gangrene, not specified as recurrent: Secondary | ICD-10-CM

## 2016-04-02 NOTE — Progress Notes (Signed)
Patient's surgery has been scheduled for 04-07-16 at [facility].

## 2016-04-03 ENCOUNTER — Inpatient Hospital Stay: Admission: RE | Admit: 2016-04-03 | Payer: Medicare Other | Source: Ambulatory Visit

## 2016-04-04 ENCOUNTER — Inpatient Hospital Stay: Admission: RE | Admit: 2016-04-04 | Payer: Medicare Other | Source: Ambulatory Visit

## 2016-04-04 NOTE — Patient Instructions (Signed)
  Your procedure is scheduled on: 04-07-16 [facility]) Report to Same Day Surgery 2nd floor medical mall @ 7:15 AM   Remember: Instructions that are not followed completely may result in serious medical risk, up to and including death, or upon the discretion of your surgeon and anesthesiologist your surgery may need to be rescheduled.    _x___ 1. Do not eat food or drink liquids after midnight. No gum chewing or hard candies.     __x__ 2. No Alcohol for 24 hours before or after surgery.   __x__3. No Smoking for 24 prior to surgery.   ____  4. Bring all medications with you on the day of surgery if instructed.    __x__ 5. Notify your doctor if there is any change in your medical condition     (cold, fever, infections).     Do not wear jewelry, make-up, hairpins, clips or nail polish.  Do not wear lotions, powders, or perfumes. You may wear deodorant.  Do not shave 48 hours prior to surgery. Men may shave face and neck.  Do not bring valuables to the hospital.    [facility] is not responsible for any belongings or valuables.               Contacts, dentures or bridgework may not be worn into surgery.  Leave your suitcase in the car. After surgery it may be brought to your room.  For patients admitted to the hospital, discharge time is determined by your treatment team.   Patients discharged the day of surgery will not be allowed to drive home.    Please read over the following fact sheets that you were given:   [facility] Preparing for Surgery and or MRSA Information   _x___ Take these medicines the morning of surgery with A SIP OF WATER:    1. LOSARTAN   2. PROTONIX  3.  4.  5.  6.  ____Fleets enema or Magnesium Citrate as directed.   ____ Use CHG Soap or sage wipes as directed on instruction sheet   ____ Use inhalers on the day of surgery and bring to hospital day of surgery  _X___ Stop metformin 2 days prior to surgery-LAST DOSE ON Friday, 04-04-16    ____ Take 1/2 of  usual insulin dose the night before surgery and none on the morning of  surgery.   ____ Stop aspirin or coumadin, or plavix  __ Stop Anti-inflammatories such as Advil, Aleve, Ibuprofen, Motrin, Naproxen,          Naprosyn, Goodies powders or aspirin products. Ok to take Tylenol.   _X___ Stop supplements until after surgery-STOP VITAMIN E NOW  ____ Bring C-Pap to the hospital.

## 2016-04-04 NOTE — Pre-Procedure Instructions (Signed)
Called Bethanie Dickeriffany Dunn, social worker on Thursday and left her a message on her office # stating that I had faxed over the paperwork that they needed to sign and gave her my fax # of where she needs to fax it back to.  She never called me back on Thursday so I called her again this morning (Friday) and left her another message on her work # and then I called her cell #. She answered and said she had received the papers but that management was having to sign their part. I called Caryl-Lyn at Dr Luan MooreSankar's office and notified her of this also. I spoke with Caryl-Lyn again @ 1 pm today and told her the papers still have not come to us in PAT yet and to let Dr Evette Cristalsankar know of this. Lynnette from the facility called me and was wandering the status of the paperwork. I told her we are still waiting on the paperwork. I went over pts instructions with her and also faxed her the instructions as she will be bringing Mr Margo AyeHall on Monday.  Sent chart over to [employer] and informed them that the paperwork is still not here.

## 2016-04-06 MED ORDER — CEFAZOLIN SODIUM-DEXTROSE 2-4 GM/100ML-% IV SOLN
2.0000 g | INTRAVENOUS | Status: AC
  Administered 2016-04-07: 2 g via INTRAVENOUS

## 2016-04-07 ENCOUNTER — Ambulatory Visit
Admission: RE | Admit: 2016-04-07 | Discharge: 2016-04-07 | Disposition: A | Discharge status: Home / Self Care | Payer: Medicare Other | Source: Ambulatory Visit | Attending: General Surgery | Admitting: General Surgery

## 2016-04-07 ENCOUNTER — Ambulatory Visit: Payer: Medicare Other | Admitting: Anesthesiology

## 2016-04-07 ENCOUNTER — Encounter
Admission: RE | Disposition: A | Discharge status: Home / Self Care | Payer: Self-pay | Source: Ambulatory Visit | Attending: General Surgery

## 2016-04-07 ENCOUNTER — Encounter: Payer: Self-pay | Admitting: *Deleted

## 2016-04-07 DIAGNOSIS — E119 Type 2 diabetes mellitus without complications: Secondary | ICD-10-CM | POA: Insufficient documentation

## 2016-04-07 DIAGNOSIS — K4090 Unilateral inguinal hernia, without obstruction or gangrene, not specified as recurrent: Secondary | ICD-10-CM | POA: Insufficient documentation

## 2016-04-07 DIAGNOSIS — E871 Hypo-osmolality and hyponatremia: Secondary | ICD-10-CM | POA: Diagnosis not present

## 2016-04-07 DIAGNOSIS — I10 Essential (primary) hypertension: Secondary | ICD-10-CM | POA: Insufficient documentation

## 2016-04-07 DIAGNOSIS — Z79899 Other long term (current) drug therapy: Secondary | ICD-10-CM | POA: Insufficient documentation

## 2016-04-07 DIAGNOSIS — F79 Unspecified intellectual disabilities: Secondary | ICD-10-CM | POA: Diagnosis not present

## 2016-04-07 DIAGNOSIS — Z7984 Long term (current) use of oral hypoglycemic drugs: Secondary | ICD-10-CM | POA: Diagnosis not present

## 2016-04-07 HISTORY — PX: INGUINAL HERNIA REPAIR: SHX194

## 2016-04-07 LAB — BASIC METABOLIC PANEL
ANION GAP: 4 — AB (ref 5–15)
BUN: 7 mg/dL (ref 6–20)
CHLORIDE: 107 mmol/L (ref 101–111)
CO2: 28 mmol/L (ref 22–32)
Calcium: 8.9 mg/dL (ref 8.9–10.3)
Creatinine, Ser: 0.49 mg/dL — ABNORMAL LOW (ref 0.61–1.24)
Glucose, Bld: 119 mg/dL — ABNORMAL HIGH (ref 65–99)
POTASSIUM: 4.2 mmol/L (ref 3.5–5.1)
SODIUM: 139 mmol/L (ref 135–145)

## 2016-04-07 LAB — GLUCOSE, CAPILLARY: Glucose-Capillary: 110 mg/dL — ABNORMAL HIGH (ref 65–99)

## 2016-04-07 SURGERY — REPAIR, HERNIA, INGUINAL, ADULT
Anesthesia: General | Laterality: Right

## 2016-04-07 MED ORDER — FENTANYL CITRATE (PF) 100 MCG/2ML IJ SOLN
INTRAMUSCULAR | Status: DC | PRN
Start: 2016-04-07 — End: 2016-04-07
  Administered 2016-04-07: 50 ug via INTRAVENOUS

## 2016-04-07 MED ORDER — PROPOFOL 500 MG/50ML IV EMUL
INTRAVENOUS | Status: DC | PRN
  Administered 2016-04-07: 50 ug/kg/min via INTRAVENOUS

## 2016-04-07 MED ORDER — ONDANSETRON HCL 4 MG/2ML IJ SOLN
INTRAMUSCULAR | Status: DC | PRN
  Administered 2016-04-07: 4 mg via INTRAVENOUS

## 2016-04-07 MED ORDER — FENTANYL CITRATE (PF) 100 MCG/2ML IJ SOLN
INTRAMUSCULAR | Status: AC
  Administered 2016-04-07: 25 ug via INTRAVENOUS
  Filled 2016-04-07: qty 2

## 2016-04-07 MED ORDER — KETOROLAC TROMETHAMINE 30 MG/ML IJ SOLN
INTRAMUSCULAR | Status: DC | PRN
  Administered 2016-04-07: 30 mg via INTRAVENOUS

## 2016-04-07 MED ORDER — ACETAMINOPHEN 10 MG/ML IV SOLN
INTRAVENOUS | Status: AC
  Filled 2016-04-07: qty 100

## 2016-04-07 MED ORDER — LIDOCAINE HCL (PF) 1 % IJ SOLN
INTRAMUSCULAR | Status: AC
  Filled 2016-04-07: qty 30

## 2016-04-07 MED ORDER — SODIUM CHLORIDE 0.9 % IV SOLN
INTRAVENOUS | Status: DC
  Administered 2016-04-07 (×2): via INTRAVENOUS

## 2016-04-07 MED ORDER — ACETAMINOPHEN 10 MG/ML IV SOLN
INTRAVENOUS | Status: DC | PRN
  Administered 2016-04-07: 1000 mg via INTRAVENOUS

## 2016-04-07 MED ORDER — ONDANSETRON HCL 4 MG/2ML IJ SOLN: 4.0000 mg | Freq: Once | INTRAMUSCULAR | Status: DC | PRN

## 2016-04-07 MED ORDER — LIDOCAINE HCL 1 % IJ SOLN
INTRAMUSCULAR | Status: DC | PRN
  Administered 2016-04-07: 24 mL via SUBCUTANEOUS

## 2016-04-07 MED ORDER — BUPIVACAINE HCL (PF) 0.5 % IJ SOLN
INTRAMUSCULAR | Status: AC
  Filled 2016-04-07: qty 30

## 2016-04-07 MED ORDER — TRAMADOL HCL 50 MG PO TABS: 50.0000 mg | ORAL_TABLET | Freq: Four times a day (QID) | ORAL | 0 refills | Status: DC | PRN

## 2016-04-07 MED ORDER — FENTANYL CITRATE (PF) 100 MCG/2ML IJ SOLN
25.0000 ug | INTRAMUSCULAR | Status: DC | PRN
  Administered 2016-04-07 (×2): 25 ug via INTRAVENOUS

## 2016-04-07 MED ORDER — MIDAZOLAM HCL 2 MG/2ML IJ SOLN
INTRAMUSCULAR | Status: DC | PRN
  Administered 2016-04-07: 1 mg via INTRAVENOUS

## 2016-04-07 MED ORDER — CHLORHEXIDINE GLUCONATE CLOTH 2 % EX PADS: 6.0000 | MEDICATED_PAD | Freq: Once | CUTANEOUS | Status: DC

## 2016-04-07 MED ORDER — CEFAZOLIN SODIUM-DEXTROSE 2-4 GM/100ML-% IV SOLN
INTRAVENOUS | Status: AC
  Filled 2016-04-07: qty 100

## 2016-04-07 SURGICAL SUPPLY — 37 items
BLADE SURG 15 STRL SS SAFETY (BLADE) ×2 IMPLANT
CANISTER SUCT 1200ML W/VALVE (MISCELLANEOUS) ×2 IMPLANT
CHLORAPREP W/TINT 26ML (MISCELLANEOUS) ×2 IMPLANT
CLIPPER SURGICAL HAIR RECHARGE (MISCELLANEOUS)
DECANTER SPIKE VIAL GLASS SM (MISCELLANEOUS) IMPLANT
DERMABOND ADVANCED (GAUZE/BANDAGES/DRESSINGS)
DERMABOND ADVANCED .7 DNX12 (GAUZE/BANDAGES/DRESSINGS) IMPLANT
DRAIN PENROSE 1/4X12 LTX (DRAIN) ×2 IMPLANT
DRAPE LAPAROTOMY 100X77 ABD (DRAPES) ×2 IMPLANT
ELECT REM PT RETURN 9FT ADLT (ELECTROSURGICAL) ×2
ELECTRODE REM PT RTRN 9FT ADLT (ELECTROSURGICAL) ×1 IMPLANT
GLOVE BIO SURGEON STRL SZ7 (GLOVE) ×8 IMPLANT
GLOVE BIOGEL PI IND STRL 7.0 (GLOVE) ×4 IMPLANT
GLOVE BIOGEL PI INDICATOR 7.0 (GLOVE) ×4
GOWN STRL REUS W/ TWL LRG LVL3 (GOWN DISPOSABLE) ×3 IMPLANT
GOWN STRL REUS W/ TWL XL LVL3 (GOWN DISPOSABLE) ×1 IMPLANT
GOWN STRL REUS W/TWL LRG LVL3 (GOWN DISPOSABLE) ×3
GOWN STRL REUS W/TWL XL LVL3 (GOWN DISPOSABLE) ×1
KIT RM TURNOVER STRD PROC AR (KITS) ×2 IMPLANT
LABEL OR SOLS (LABEL) ×2 IMPLANT
LIQUID BAND (GAUZE/BANDAGES/DRESSINGS) IMPLANT
MESH PARIETEX PROGRIP RIGHT (Mesh General) ×2 IMPLANT
NDL HPO THNWL 1X22GA REG BVL (NEEDLE) ×1 IMPLANT
NEEDLE HYPO 25X1 1.5 SAFETY (NEEDLE) ×2 IMPLANT
NEEDLE SAFETY 22GX1 (NEEDLE) ×1
NS IRRIG 500ML POUR BTL (IV SOLUTION) ×2 IMPLANT
PACK BASIN MINOR ARMC (MISCELLANEOUS) ×2 IMPLANT
SURGICAL HAIR CLIPPER RECHARGE (MISCELLANEOUS) IMPLANT
SUT PDS 2-0 27IN (SUTURE) ×4 IMPLANT
SUT VIC AB 2-0 SH 27 (SUTURE) ×1
SUT VIC AB 2-0 SH 27XBRD (SUTURE) ×1 IMPLANT
SUT VIC AB 3-0 54X BRD REEL (SUTURE) ×1 IMPLANT
SUT VIC AB 3-0 BRD 54 (SUTURE) ×1
SUT VIC AB 3-0 SH 27 (SUTURE) ×1
SUT VIC AB 3-0 SH 27X BRD (SUTURE) ×1 IMPLANT
SUT VIC AB 4-0 FS2 27 (SUTURE) ×2 IMPLANT
SYR CONTROL 10ML (SYRINGE) ×2 IMPLANT

## 2016-04-07 NOTE — H&P (View-Only) (Signed)
Patient ID: Reginald Hall, male   DOB: 01/07/1966, 50 y.o.   MRN: [id number]  Chief Complaint  Patient presents with  . Follow-up    skin lesion    HPI Reginald Hall is a 50 y.o. male here today for his follow up regarding skin lesions on his back. He states the areas are gone. No new skin problems. He has a right inguinal hernia which is bothersome, but he has not voiced any complaints until today.  I have reviewed the history of present illness with the patient.  [name].HPI  Past Medical History:  Diagnosis Date  . Diabetes mellitus without complication [facility](HCC)    Patient takes Metformin  . Hypertension   . Hyponatremia   . Mental retardation     Past Surgical History:  Procedure Laterality Date  . ESOPHAGOGASTRODUODENOSCOPY (EGD) WITH PROPOFOL N/A 03/23/2015   Procedure: ESOPHAGOGASTRODUODENOSCOPY (EGD) WITH PROPOFOL;  Surgeon: Elnita MaxwellMatthew Gordon Rein, MD;  Location[facility] ENDOSCOPY;  Service: Endoscopy;  Laterality: N/A;    No family history on file.  Social History Social History  Substance Use Topics  . Smoking status: Never Smoker  . Smokeless tobacco: Never Used  . Alcohol use 0.0 oz/week    No Known Allergies  Current Outpatient Prescriptions  Medication Sig Dispense Refill  . docusate sodium (COLACE) 100 MG capsule Take 100 mg by mouth 2 (two) times daily.    . ferrous sulfate 325 (65 FE) MG tablet Take 325 mg by mouth 2 (two) times daily with a meal.    . glimepiride (AMARYL) 4 MG tablet Take 4 mg by mouth daily with breakfast.    . losartan (COZAAR) 50 MG tablet Take 50 mg by mouth daily.    . metFORMIN (GLUCOPHAGE) 1000 MG tablet Take 1,000 mg by mouth 2 (two) times daily with a meal.    . pantoprazole (PROTONIX) 20 MG tablet Take 20 mg by mouth daily.     Current Facility-Administered Medications  Medication Dose Route Frequency Provider Last Rate Last Dose  . lanolin/mineral oil (KERI/THERA-DERM) lotion   Topical PRN Seeplaputhur [name], MD        Review of  Systems Review of Systems  Constitutional: Negative.   Respiratory: Negative.   Cardiovascular: Negative.     Blood pressure 116/78, pulse 76, resp. rate 12, height 4\' 11"  (1.499 Hall), weight 93 lb (42.2 kg).  Physical Exam Physical Exam  Constitutional: He is oriented to person, place, and time. He appears well-developed and well-nourished.  Eyes: Conjunctivae are normal. No scleral icterus.  Neck: Neck supple.  Cardiovascular: Normal rate, regular rhythm and normal heart sounds.   Pulmonary/Chest: Effort normal and breath sounds normal.  Abdominal: Soft. Normal appearance and bowel sounds are normal. He exhibits no distension. A hernia is present. Hernia confirmed positive in the right inguinal area ( Medium-large size. Reducible but recurs easily ).  Genitourinary:     Lymphadenopathy:    He has no cervical adenopathy.  Neurological: He is alert and oriented to person, place, and time.  Skin: Skin is warm and dry.  All skin lesions on his back have healed fully. The right inguinal hernia feels to have enlarged since last seen several weeks ago. Data Reviewed Previous note  Assessment    Skin lesions- no longer present Right inguinal hernia, enlarging, per pt this is painful at times    Plan    Talked with Dr. Maryellen PileEason, pt's PCP. He is felt to be medically stable to have the repair done.  Will contact pt's POA for consent. Pt and his caregiver seem agreeable to this Hernia precautions and incarceration were discussed with the patient. If he develops symptoms of an incarcerated hernia, they were encouraged to seek prompt medical attention.  I have recommended repair of the hernia using mesh on an outpatient basis in the near future. The risk of infection was reviewed. The role of prosthetic mesh to minimize the risk of recurrence was reviewed..  This information has been scribed by Ples SpecterJessica Qualls CMA.        SANKAR,SEEPLAPUTHUR G 03/25/2016, 3:01 PM

## 2016-04-07 NOTE — Interval H&P Note (Signed)
History and Physical Interval Note:  04/07/2016 7:20 AM  Reginald Hall  has presented today for surgery, with the diagnosis of RIGHT INGUINAL HERNIA  The various methods of treatment have been discussed with the patient and family. After consideration of risks, benefits and other options for treatment, the patient has consented to  Procedure(s): HERNIA REPAIR INGUINAL ADULT (Right) as a surgical intervention .  The patient's history has been reviewed, patient examined, no change in status, stable for surgery.  I have reviewed the patient's chart and labs.  Questions were answered to the patient's satisfaction.     [name]

## 2016-04-07 NOTE — Op Note (Signed)
Preop diagnosis: Right inguinal hernia  Post op diagnosis: Same, direct type  Operation: Repair right inguinal hernia with Progrip mesh  Surgeon: S.G.Sankar  Assistant:     Anesthesia: Monitored anesthesia care. 25 mL of local anesthetic containing 0.5% Marcaine mixed with 1% Xylocaine used  Complications: None  EBL: Less than 5 mL  Drains: None   Description: Patient was placed supine on the operating table and with adequate sedation and monitoring the right inguinal area was prepped and draped as sterile field. Timeout was performed. Local anesthetic was instilled to obtain an adequate block around the right inguinal region. Incision was made along the medial two thirds of the inguinal canal and deepened through the layers down to the external oblique. Bleeding controlled cautery and ligatures of 3-0 Vicryl. The external oblique was opened along the line line of its fibers. Further dissection revealed the patient had a notable direct hernia located medial to the internal ring. There was an actual slit along the transversalis fascia through which the preperitoneal fat was positioned through fairly significantly. The cord was opened to ensure there was no indirect sac and none was found. The fascia over the cord was reapproximated with 3-0 Vicryl. After posterior wall was satisfactorily exposed and the mesh brought up to the field it was cut to approximate length and configuration. The mesh was then laid around the cord and laid down all the way over the posterior wall of the inguinal canal and the lateral ends tucked underneath the external oblique. Medial end of the mesh was tacked to the pubic tubercle with 2 stitches of 2-0 PDS. External oblique was then closed over with a running 2-0 PDS. The nerve was noted be lying along the course of the cord and was preserved during the procedure. Wound was irrigated subcutaneous tissue closed with the running 3-0 Vicryl. Skin closed with subcuticular 4-0  Vicryl covered with Dermabond. Patient subsequently was returned recovery room in stable condition..[name]

## 2016-04-07 NOTE — Anesthesia Preprocedure Evaluation (Addendum)
Anesthesia Evaluation  Patient identified by MRN, date of birth, ID band Patient awake    Reviewed: Allergy & Precautions, H&P , NPO status , Patient's Chart, lab work & pertinent test results, reviewed documented beta blocker date and time   Airway Mallampati: II  TM Distance: >3 FB Neck ROM: full    Dental  (+) Teeth Intact   Pulmonary neg pulmonary ROS,    Pulmonary exam normal        Cardiovascular Exercise Tolerance: Good hypertension, negative cardio ROS Normal cardiovascular exam Rhythm:regular Rate:Normal     Neuro/Psych PSYCHIATRIC DISORDERS negative neurological ROS  negative psych ROS   GI/Hepatic negative GI ROS, Neg liver ROS,   Endo/Other  negative endocrine ROSdiabetes  Renal/GU negative Renal ROS  negative genitourinary   Musculoskeletal   Abdominal   Peds  Hematology negative hematology ROS (+)   Anesthesia Other Findings   Reproductive/Obstetrics negative OB ROS                             Anesthesia Physical Anesthesia Plan  ASA: III  Anesthesia Plan: General LMA   Post-op Pain Management:    Induction:   Airway Management Planned:   Additional Equipment:   Intra-op Plan:   Post-operative Plan:   Informed Consent: I have reviewed the patients History and Physical, chart, labs and discussed the procedure including the risks, benefits and alternatives for the proposed anesthesia with the patient or authorized representative who has indicated his/her understanding and acceptance.     Plan Discussed with: CRNA  Anesthesia Plan Comments: (Ecg noted and shows late R progression with a junctional rhythm.  No cardiac sx or anginal equivalents per hx.  Had an  Unremarkable anesthetic in 2016 for endoscopy.  Feel pt. Is stable for above.  JA)       Anesthesia Quick Evaluation

## 2016-04-07 NOTE — Transfer of Care (Signed)
Immediate Anesthesia Transfer of Care Note  Patient: Reginald Hall  Procedure(s) Performed: Procedure(s): HERNIA REPAIR INGUINAL ADULT (Right)  Patient Location: PACU  Anesthesia Type:General  Level of Consciousness: awake and sedated  Airway & Oxygen Therapy: Patient Spontanous Breathing and Patient connected to face mask oxygen  Post-op Assessment: Report given to RN and Post -op Vital signs reviewed and stable  Post vital signs: Reviewed and stable  Last Vitals:  Vitals:   04/07/16 0727  BP: (!) 145/78  Pulse: 70  Resp: 18  Temp: 36.7 C    Last Pain:  Vitals:   04/07/16 0727  TempSrc: Tympanic         Complications: No apparent anesthesia complications

## 2016-04-07 NOTE — Anesthesia Postprocedure Evaluation (Signed)
Anesthesia Post Note  Patient: [name]  Procedure(s) Performed: Procedure(s) (LRB): HERNIA REPAIR INGUINAL ADULT (Right)  Patient location during evaluation: PACU Anesthesia Type: General Level of consciousness: awake and alert Pain management: pain level controlled Vital Signs Assessment: post-procedure vital signs reviewed and stable Respiratory status: spontaneous breathing, nonlabored ventilation, respiratory function stable and patient connected to nasal cannula oxygen Cardiovascular status: blood pressure returned to baseline and stable Postop Assessment: no signs of nausea or vomiting Anesthetic complications: no    Last Vitals:  Vitals:   04/07/16 1114 04/07/16 1135  BP: 126/71 138/79  Pulse: (!) 56 66  Resp: 16 16  Temp: 36.6 C     Last Pain:  Vitals:   04/07/16 1114  TempSrc:   PainSc: 0-No pain                 Yevette EdwardsJames G Adams

## 2016-04-07 NOTE — Progress Notes (Signed)
[facility] with Lynnette.  Still do not have permission From DSS for surgery.  Vertell LimberMilissa M. RN aware of Situation and also Dr. Evette CristalSankar aware we still do not Have approval.

## 2016-04-07 NOTE — Anesthesia Procedure Notes (Signed)
Date/Time: 04/07/2016 9:15 AM Performed by: Junious SilkNOLES, Colie Fugitt Pre-anesthesia Checklist: Patient identified, Emergency Drugs available, Suction available, Patient being monitored and Timeout performed Oxygen Delivery Method: Simple face mask

## 2016-04-07 NOTE — Progress Notes (Signed)
Spoke with Melynda KellerSusan Osborne over the phone, gave Verbal permission for procedure and will fax appropriate Paper and consent to us once she arrives at the office.

## 2016-04-07 NOTE — Discharge Instructions (Signed)
DISCHARGE INSTRUCTIONS   1) The drugs that you were given will stay in your system until tomorrow so for the next 24 hours you should not:  A) Drive an automobile B) Make any legal decisions C) Drink any alcoholic beverage   2) You may resume regular meals tomorrow.  Today it is better to start with liquids and gradually work up to solid foods.  You may eat anything you prefer, but it is better to start with liquids, then soup and crackers, and gradually work up to solid foods.   3) Please notify your doctor immediately if you have any unusual bleeding, trouble breathing, redness and pain at the surgery site, drainage, fever, or pain not relieved by medication.    4) Additional Instructions:        Please contact your physician with any problems or Same Day Surgery at [phone], Monday through Friday 6 am to 4 pm, or [place] at [facility] Main number at [phone].   Document Released: 10/12/2008 Document Revised: 08/18/2011 Document Reviewed: 11/27/2014 Elsevier Interactive Patient Education 2016 Elsevier Inc.   Open Hernia Repair, Care After These instructions give you information about caring for yourself after your procedure. Your doctor may also give you more specific instructions. Call your doctor if you have any problems or questions after your procedure. HOME CARE  Keep the cut (incision) area clean and dry. You may gently wash the incision area with soap and water 48 hours after surgery. To dry the incision area, gently blot or dab it.  Do not take baths, swim, or use a hot tub for 10 days or until your doctor approves.  Change bandages (dressings) as told by your doctor.  Check your incision area every day for signs of infection. Watch for:  Redness, swelling, or pain.  Fluid , blood, or pus.  Eat plenty of fruits and vegetables. This helps to prevent constipation.  Drink enough fluid to keep your pee (urine) clear or pale yellow. This also helps  to prevent constipation.  Do not drive or operate heavy machinery until your doctor says it is okay.  Do not lift anything that is heavier than 10 lb (4.5 kg) until your doctor approves.  Do not play contact sports for 4 weeks or until your doctor approves.  Take medicines only as told by your doctor.  Keep all follow-up visits as told by your doctor. This is important. Ask your doctor when to make an appointment to have your stitches (sutures) or staples removed. GET HELP IF:  The incision is bleeding more than before.  You have blood in your poop (stool).  The incision hurts more than before.  You have redness, swelling, or pain in your incision area.  You have fluid, blood, or pus coming from your incision.  You have a fever.  You notice a bad smell coming from the incision area or the dressing. GET HELP RIGHT AWAY IF:  You have a rash.  Your chest hurts.  You are short of breath.  You feel light-headed.  You feel weak and dizzy (feel faint).   This information is not intended to replace advice given to you by your health care provider. Make sure you discuss any questions you have with your health care provider.   Document Released: 06/16/2014 Document Reviewed: 06/16/2014 Elsevier Interactive Patient Education [employer]2016 Elsevier Inc.

## 2016-04-07 NOTE — Progress Notes (Signed)
Called Tiffany Dunn at [facility] and left a message in Reference to still needing consent.  Asked to please Call us back.

## 2016-04-14 ENCOUNTER — Encounter: Payer: Self-pay | Admitting: General Surgery

## 2016-04-14 ENCOUNTER — Ambulatory Visit (INDEPENDENT_AMBULATORY_CARE_PROVIDER_SITE_OTHER): Payer: Medicare Other | Admitting: General Surgery

## 2016-04-14 VITALS — BP 112/72 | HR 74 | Resp 12 | Ht 59.0 in | Wt 96.0 lb

## 2016-04-14 DIAGNOSIS — K4090 Unilateral inguinal hernia, without obstruction or gangrene, not specified as recurrent: Secondary | ICD-10-CM

## 2016-04-14 NOTE — Progress Notes (Signed)
Patient ID: Reginald Hall Legrand, male   DOB: 11/21/1965, 50 y.o.   MRN: [id number]  Chief Complaint  Patient presents with  . Routine Post Op    right inguinal hernia    HPI Reginald Hall Boom is a 50 y.o. male here today for his post op right inguinal hernia repair done on 04/07/2016.  Admits to some mild pain, but no acute complaints. I have reviewed the history of present illness with the patient.  HPI  Past Medical History:  Diagnosis Date  . Diabetes mellitus without complication [facility](HCC)    Patient takes Metformin  . Hypertension   . Hyponatremia   . Mental retardation     Past Surgical History:  Procedure Laterality Date  . ESOPHAGOGASTRODUODENOSCOPY (EGD) WITH PROPOFOL N/A 03/23/2015   Procedure: ESOPHAGOGASTRODUODENOSCOPY (EGD) WITH PROPOFOL;  Surgeon: Elnita MaxwellMatthew Gordon Rein, MD;  [facility];  Service: Endoscopy;  Laterality: N/A;  . INGUINAL HERNIA REPAIR Right 04/07/2016   Procedure: HERNIA REPAIR INGUINAL ADULT;  Surgeon: Kieth BrightlySeeplaputhur G Iisha Soyars, MD;  Location: [facility] ORS;  Service: General;  Laterality: Right;    No family history on file.  Social History Social History  Substance Use Topics  . Smoking status: Never Smoker  . Smokeless tobacco: Never Used  . Alcohol use 0.0 oz/week    Allergies  Allergen Reactions  . Other     GLUTEN INTOLERANCE    Current Outpatient Prescriptions  Medication Sig Dispense Refill  . cholecalciferol (VITAMIN D) 1000 units tablet Take 1,000 Units by mouth daily.    [name]. docusate sodium (COLACE) 100 MG capsule Take 100 mg by mouth 2 (two) times daily.    . ferrous sulfate 325 (65 FE) MG tablet Take 325 mg by mouth 2 (two) times daily with a meal.    . glimepiride (AMARYL) 4 MG tablet Take 4 mg by mouth daily with breakfast.    . lanolin/mineral oil (KERI/THERA-DERM) LOTN Apply 1 application topically as needed for dry skin. (Patient taking differently: Apply 1 application topically 2 (two) times daily. ) 1 Bottle 6  . loratadine  (CLARITIN) 10 MG tablet Take 10 mg by mouth daily.    [name]. losartan (COZAAR) 50 MG tablet Take 50 mg by mouth every morning.     . metFORMIN (GLUCOPHAGE) 1000 MG tablet Take 1,000 mg by mouth 2 (two) times daily.     . pantoprazole (PROTONIX) 40 MG tablet Take 40 mg by mouth 2 (two) times daily.    . traMADol (ULTRAM) 50 MG tablet Take 1 tablet (50 mg total) by mouth every 6 (six) hours as needed. 20 tablet 0  . vitamin B-12 (CYANOCOBALAMIN) 1000 MCG tablet Take 1,000 mcg by mouth daily.    . vitamin E 400 UNIT capsule Take 400 Units by mouth daily.     Current Facility-Administered Medications  Medication Dose Route Frequency Provider Last Rate Last Dose  . lanolin/mineral oil (KERI/THERA-DERM) lotion   Topical PRN Chalice Philbert Wynona LunaG Bernetta Sutley, MD        Review of Systems Review of Systems  Constitutional: Negative.   Respiratory: Negative.   Cardiovascular: Negative.     Blood pressure 112/72, pulse 74, resp. rate 12, height 4\' 11"  (1.499 Hall), weight 96 lb (43.5 kg).  Physical Exam Physical Exam  Constitutional: He is oriented to person, place, and time. He appears well-developed and well-nourished.  Eyes: Conjunctivae are normal. No scleral icterus.  Abdominal: Soft. Normal appearance and bowel sounds are normal. There is no hepatomegaly. There is no tenderness.  Neurological: He is alert and oriented to person, place, and time.  Skin: Skin is warm and dry.    Data Reviewed Notes reviewed   Assessment    1 week post-op right inguinal hernia with small seroma    Plan      The patient is aware to use a heating pad as needed for comfort. Patient to call office if fluid-filled area becomes larger or painful before follow-up appointment in 3 weeks.  This information has been scribed by Ples SpecterJessica Qualls CMA.   Waylon Hershey G 04/14/2016, 2:20 PM

## 2016-04-14 NOTE — Patient Instructions (Addendum)
Return in three weeks. The patient is aware to use a heating pad as needed for comfort.

## 2016-04-16 ENCOUNTER — Ambulatory Visit (INDEPENDENT_AMBULATORY_CARE_PROVIDER_SITE_OTHER): Payer: Medicare Other | Admitting: General Surgery

## 2016-04-16 ENCOUNTER — Encounter: Payer: Self-pay | Admitting: General Surgery

## 2016-04-16 VITALS — BP 110/78 | HR 70 | Resp 12 | Ht 59.0 in | Wt 92.0 lb

## 2016-04-16 DIAGNOSIS — L7634 Postprocedural seroma of skin and subcutaneous tissue following other procedure: Secondary | ICD-10-CM

## 2016-04-16 DIAGNOSIS — K4090 Unilateral inguinal hernia, without obstruction or gangrene, not specified as recurrent: Secondary | ICD-10-CM

## 2016-04-16 NOTE — Patient Instructions (Signed)
Continue heat several times a day.  Return as needed

## 2016-04-16 NOTE — Progress Notes (Signed)
Patient ID: Reginald Hall, male   DOB: 11/29/1965, 50 y.o.   MRN: [id number]  Chief Complaint  Patient presents with  . Follow-up    HPI Reginald Hall is a 50 y.o. male here today following up from his right inguinal hernia repair. He states the area is still swolllen. He was seen in the office 2 days ago and had mild swelling on medial end of incision.  He has been applying heat to area. No other acute complaints at this time.    I have reviewed the history of present illness with the patient.  HPI  Past Medical History:  Diagnosis Date  . Diabetes mellitus without complication [facility](HCC)    Patient takes Metformin  . Hypertension   . Hyponatremia   . Mental retardation     Past Surgical History:  Procedure Laterality Date  . ESOPHAGOGASTRODUODENOSCOPY (EGD) WITH PROPOFOL N/A 03/23/2015   Procedure: ESOPHAGOGASTRODUODENOSCOPY (EGD) WITH PROPOFOL;  Surgeon: Elnita MaxwellMatthew Gordon Rein, MD;  [facility];  Service: Endoscopy;  Laterality: N/A;  . INGUINAL HERNIA REPAIR Right 04/07/2016   Procedure: HERNIA REPAIR INGUINAL ADULT;  Surgeon: Kieth BrightlySeeplaputhur G Sankar, MD;  Location: [facility] ORS;  Service: General;  Laterality: Right;    No family history on file.  Social History Social History  Substance Use Topics  . Smoking status: Never Smoker  . Smokeless tobacco: Never Used  . Alcohol use 0.0 oz/week    Allergies  Allergen Reactions  . Other     GLUTEN INTOLERANCE    Current Outpatient Prescriptions  Medication Sig Dispense Refill  . cholecalciferol (VITAMIN D) 1000 units tablet Take 1,000 Units by mouth daily.    [name]. docusate sodium (COLACE) 100 MG capsule Take 100 mg by mouth 2 (two) times daily.    . ferrous sulfate 325 (65 FE) MG tablet Take 325 mg by mouth 2 (two) times daily with a meal.    . glimepiride (AMARYL) 4 MG tablet Take 4 mg by mouth daily with breakfast.    . lanolin/mineral oil (KERI/THERA-DERM) LOTN Apply 1 application topically as needed for dry skin.  (Patient taking differently: Apply 1 application topically 2 (two) times daily. ) 1 Bottle 6  . loratadine (CLARITIN) 10 MG tablet Take 10 mg by mouth daily.    [name]. losartan (COZAAR) 50 MG tablet Take 50 mg by mouth every morning.     . metFORMIN (GLUCOPHAGE) 1000 MG tablet Take 1,000 mg by mouth 2 (two) times daily.     . pantoprazole (PROTONIX) 40 MG tablet Take 40 mg by mouth 2 (two) times daily.    . traMADol (ULTRAM) 50 MG tablet Take 1 tablet (50 mg total) by mouth every 6 (six) hours as needed. 20 tablet 0  . vitamin B-12 (CYANOCOBALAMIN) 1000 MCG tablet Take 1,000 mcg by mouth daily.    . vitamin E 400 UNIT capsule Take 400 Units by mouth daily.     Current Facility-Administered Medications  Medication Dose Route Frequency Provider Last Rate Last Dose  . lanolin/mineral oil (KERI/THERA-DERM) lotion   Topical PRN Seeplaputhur [name], MD        Review of Systems Review of Systems  Constitutional: Negative.   Respiratory: Negative.   Cardiovascular: Negative.     Blood pressure 110/78, pulse 70, resp. rate 12, height 4\' 11"  (1.499 m), weight 92 lb (41.7 kg).  Physical Exam Physical Exam  Constitutional: He is oriented to person, place, and time. He appears well-developed and well-nourished.  Abdominal:  Neurological: He is alert and oriented to person, place, and time.  Skin: Skin is warm and dry.    Data Reviewed Previous note  Assessment    1 week post-op right inguinal hernia with small seroma-appears to be draining     Plan   Pt reassured.  Continue to apply heat several times a day as instructed.  Return as scheduled.  This information has been scribed by Ples SpecterJessica Qualls CMA.      SANKAR,SEEPLAPUTHUR G 04/16/2016, 11:57 AM

## 2016-04-28 ENCOUNTER — Encounter: Payer: Self-pay | Admitting: General Surgery

## 2016-04-28 ENCOUNTER — Ambulatory Visit: Payer: Medicare Other | Admitting: General Surgery

## 2016-04-28 VITALS — BP 116/64 | HR 74 | Resp 12 | Ht 60.0 in | Wt 91.0 lb

## 2016-04-28 DIAGNOSIS — L7634 Postprocedural seroma of skin and subcutaneous tissue following other procedure: Secondary | ICD-10-CM

## 2016-04-28 DIAGNOSIS — T148XXA Other injury of unspecified body region, initial encounter: Secondary | ICD-10-CM

## 2016-04-28 DIAGNOSIS — L089 Local infection of the skin and subcutaneous tissue, unspecified: Secondary | ICD-10-CM

## 2016-04-28 MED ORDER — DOXYCYCLINE HYCLATE 100 MG PO CAPS
100.0000 mg | ORAL_CAPSULE | Freq: Two times a day (BID) | ORAL | 0 refills | Status: DC
Start: 2016-04-28 — End: 2016-05-29

## 2016-04-28 NOTE — Progress Notes (Signed)
Patient ID: Reginald StallJeffrey M Desilets, male   DOB: 01/17/1966, 50 y.o.   MRN: [id number]  Chief Complaint  Patient presents with  . Follow-up    HPI Reginald Hall is a 50 y.o. male here today for a evaluation of a infected surgical site with history of post-op seroma. Right inguinal hernia was performed on 04/07/2016.  His caregiver states the area looks swollen and it drained some over the weekend. Denies fever.  I have reviewed the history of present illness with the patient.  HPI  Past Medical History:  Diagnosis Date  . Diabetes mellitus without complication [facility](HCC)    Patient takes Metformin  . Hypertension   . Hyponatremia   . Mental retardation     Past Surgical History:  Procedure Laterality Date  . ESOPHAGOGASTRODUODENOSCOPY (EGD) WITH PROPOFOL N/A 03/23/2015   Procedure: ESOPHAGOGASTRODUODENOSCOPY (EGD) WITH PROPOFOL;  Surgeon: Elnita MaxwellMatthew Gordon Rein, MD;  [facility];  Service: Endoscopy;  Laterality: N/A;  . INGUINAL HERNIA REPAIR Right 04/07/2016   Procedure: HERNIA REPAIR INGUINAL ADULT;  Surgeon: Kieth BrightlySeeplaputhur G Sankar, MD;  Location: [facility] ORS;  Service: General;  Laterality: Right;    History reviewed. No pertinent family history.  Social History Social History  Substance Use Topics  . Smoking status: Never Smoker  . Smokeless tobacco: Never Used  . Alcohol use 0.0 oz/week    Allergies  Allergen Reactions  . Other     GLUTEN INTOLERANCE    Current Outpatient Prescriptions  Medication Sig Dispense Refill  . cholecalciferol (VITAMIN D) 1000 units tablet Take 1,000 Units by mouth daily.    [name]. docusate sodium (COLACE) 100 MG capsule Take 100 mg by mouth 2 (two) times daily.    . ferrous sulfate 325 (65 FE) MG tablet Take 325 mg by mouth 2 (two) times daily with a meal.    . glimepiride (AMARYL) 4 MG tablet Take 4 mg by mouth daily with breakfast.    . lanolin/mineral oil (KERI/THERA-DERM) LOTN Apply 1 application topically as needed for dry skin. (Patient  taking differently: Apply 1 application topically 2 (two) times daily. ) 1 Bottle 6  . loratadine (CLARITIN) 10 MG tablet Take 10 mg by mouth daily.    [name]. losartan (COZAAR) 50 MG tablet Take 50 mg by mouth every morning.     . metFORMIN (GLUCOPHAGE) 1000 MG tablet Take 1,000 mg by mouth 2 (two) times daily.     . pantoprazole (PROTONIX) 40 MG tablet Take 40 mg by mouth 2 (two) times daily.    . traMADol (ULTRAM) 50 MG tablet Take 1 tablet (50 mg total) by mouth every 6 (six) hours as needed. 20 tablet 0  . vitamin B-12 (CYANOCOBALAMIN) 1000 MCG tablet Take 1,000 mcg by mouth daily.    . vitamin E 400 UNIT capsule Take 400 Units by mouth daily.    [name]. doxycycline (VIBRAMYCIN) 100 MG capsule Take 1 capsule (100 mg total) by mouth 2 (two) times daily. 28 capsule 0   Current Facility-Administered Medications  Medication Dose Route Frequency Provider Last Rate Last Dose  . lanolin/mineral oil (KERI/THERA-DERM) lotion   Topical PRN Seeplaputhur [name], MD        Review of Systems Review of Systems  Constitutional: Negative.   Respiratory: Negative.   Cardiovascular: Negative.     Blood pressure 116/64, pulse 74, resp. rate 12, height 5' (1.524 m), weight 91 lb (41.3 kg).  Physical Exam Physical Exam  Constitutional: He is oriented to person, place, and time. He  appears well-developed and well-nourished.  Abdominal:  Seropurulent drainage lateral end of incision with surrounding erythema. Blood and pus drained from incision site.  Neurological: He is alert and oriented to person, place, and time.  Skin: Skin is warm and dry.    Data Reviewed Prior notes  Assessment    Infected seroma of right inguinal hernia incision site    Plan   Sample of pus obtained for C/S Doxycycline 100 mg BID x 2 weeks Follow-up as scheduled on 05/08/16 to reassess       SANKAR,SEEPLAPUTHUR G 04/28/2016, 1:28 PM

## 2016-05-02 LAB — ANAEROBIC AND AEROBIC CULTURE

## 2016-05-06 ENCOUNTER — Ambulatory Visit: Payer: Medicare Other | Admitting: General Surgery

## 2016-05-08 ENCOUNTER — Encounter: Payer: Self-pay | Admitting: General Surgery

## 2016-05-08 ENCOUNTER — Ambulatory Visit: Payer: Medicare Other | Admitting: General Surgery

## 2016-05-08 VITALS — Resp 14 | Ht 60.0 in | Wt 92.0 lb

## 2016-05-08 DIAGNOSIS — L089 Local infection of the skin and subcutaneous tissue, unspecified: Secondary | ICD-10-CM

## 2016-05-08 DIAGNOSIS — T148XXA Other injury of unspecified body region, initial encounter: Principal | ICD-10-CM

## 2016-05-08 NOTE — Patient Instructions (Addendum)
New RX given. Return in 3 weeks for follow-up

## 2016-05-08 NOTE — Progress Notes (Signed)
Patient ID: Reginald Hall, male   DOB: 07/19/1965, 50 y.o.   MRN: [id number]  Chief Complaint  Patient presents with  . Other    HPI Reginald Hall is a 50 y.o. male here today for a evaluation of a infected surgical site with history of post-op seroma. Right inguinal hernia was performed on 04/07/2016.     He does scratch the area and states it is swollen some. I have reviewed the history of present illness with the patient.  HPI  Past Medical History:  Diagnosis Date  . Diabetes mellitus without complication [facility](HCC)    Patient takes Metformin  . Hypertension   . Hyponatremia   . Mental retardation     Past Surgical History:  Procedure Laterality Date  . ESOPHAGOGASTRODUODENOSCOPY (EGD) WITH PROPOFOL N/A 03/23/2015   Procedure: ESOPHAGOGASTRODUODENOSCOPY (EGD) WITH PROPOFOL;  Surgeon: Elnita MaxwellMatthew Gordon Rein, MD;  [facility];  Service: Endoscopy;  Laterality: N/A;  . INGUINAL HERNIA REPAIR Right 04/07/2016   Procedure: HERNIA REPAIR INGUINAL ADULT;  Surgeon: Kieth BrightlySeeplaputhur G Sankar, MD;  Location: [facility] ORS;  Service: General;  Laterality: Right;    No family history on file.  Social History Social History  Substance Use Topics  . Smoking status: Never Smoker  . Smokeless tobacco: Never Used  . Alcohol use 0.0 oz/week    Allergies  Allergen Reactions  . Other     GLUTEN INTOLERANCE    Current Outpatient Prescriptions  Medication Sig Dispense Refill  . cholecalciferol (VITAMIN D) 1000 units tablet Take 1,000 Units by mouth daily.    [name]. docusate sodium (COLACE) 100 MG capsule Take 100 mg by mouth 2 (two) times daily.    [name]. doxycycline (VIBRAMYCIN) 100 MG capsule Take 1 capsule (100 mg total) by mouth 2 (two) times daily. 28 capsule 0  . ferrous sulfate 325 (65 FE) MG tablet Take 325 mg by mouth 2 (two) times daily with a meal.    . glimepiride (AMARYL) 4 MG tablet Take 4 mg by mouth daily with breakfast.    . lanolin/mineral oil (KERI/THERA-DERM) LOTN Apply 1  application topically as needed for dry skin. (Patient taking differently: Apply 1 application topically 2 (two) times daily. ) 1 Bottle 6  . loratadine (CLARITIN) 10 MG tablet Take 10 mg by mouth daily.    [name]. losartan (COZAAR) 50 MG tablet Take 50 mg by mouth every morning.     . metFORMIN (GLUCOPHAGE) 1000 MG tablet Take 1,000 mg by mouth 2 (two) times daily.     . pantoprazole (PROTONIX) 40 MG tablet Take 40 mg by mouth 2 (two) times daily.    . traMADol (ULTRAM) 50 MG tablet Take 1 tablet (50 mg total) by mouth every 6 (six) hours as needed. 20 tablet 0  . vitamin B-12 (CYANOCOBALAMIN) 1000 MCG tablet Take 1,000 mcg by mouth daily.    . vitamin E 400 UNIT capsule Take 400 Units by mouth daily.     Current Facility-Administered Medications  Medication Dose Route Frequency Provider Last Rate Last Dose  . lanolin/mineral oil (KERI/THERA-DERM) lotion   Topical PRN Seeplaputhur [name], MD        Review of Systems Review of Systems  Constitutional: Negative.   Respiratory: Negative.   Cardiovascular: Negative.     Resp. rate 14, height 5' (1.524 m), weight 92 lb (41.7 kg).  Physical Exam Physical Exam 1.5cm  By 0.5 cm open area lateral end of right groin incision. Dry and clean. No redness or induration  Data Reviewed Notes and C&S report Staph not sens to Doxycycline Assessment    Infected seroma- Right inguinal swelling has decreased. Responded well to drainage.     Plan    Stop doxycycline. New RX given for Septra for 1 week based on C&S report Return in 3 weeks for follow-up    This information has been scribed by Ples SpecterJessica Qualls CMA.   SANKAR,SEEPLAPUTHUR G 05/12/2016, 11:16 AM

## 2016-05-12 ENCOUNTER — Encounter: Payer: Self-pay | Admitting: General Surgery

## 2016-05-29 ENCOUNTER — Ambulatory Visit (INDEPENDENT_AMBULATORY_CARE_PROVIDER_SITE_OTHER): Payer: Medicare Other | Admitting: General Surgery

## 2016-05-29 ENCOUNTER — Encounter: Payer: Self-pay | Admitting: General Surgery

## 2016-05-29 VITALS — BP 120/72 | HR 88 | Resp 18 | Ht 59.0 in | Wt 94.0 lb

## 2016-05-29 DIAGNOSIS — K4090 Unilateral inguinal hernia, without obstruction or gangrene, not specified as recurrent: Secondary | ICD-10-CM

## 2016-05-29 DIAGNOSIS — L089 Local infection of the skin and subcutaneous tissue, unspecified: Secondary | ICD-10-CM

## 2016-05-29 DIAGNOSIS — T148XXA Other injury of unspecified body region, initial encounter: Secondary | ICD-10-CM

## 2016-05-29 NOTE — Progress Notes (Signed)
Patient ID: Reginald Hall, male   DOB: 03/13/1966, 50 y.o.   MRN: [id number]  Chief Complaint  Patient presents with  . Follow-up    HPI Reginald Hall is a 50 y.o. male.  Here today for postoperative visit. He developed an infected surgical site with history of post-op seroma. Right inguinal hernia was performed on 04/07/2016. He states the area is still a little sore, caregiver states he picks at the area. I have reviewed the history of present illness with the patient.  HPI  Past Medical History:  Diagnosis Date  . Diabetes mellitus without complication [facility](HCC)    Patient takes Metformin  . Hypertension   . Hyponatremia   . Mental retardation     Past Surgical History:  Procedure Laterality Date  . ESOPHAGOGASTRODUODENOSCOPY (EGD) WITH PROPOFOL N/A 03/23/2015   Procedure: ESOPHAGOGASTRODUODENOSCOPY (EGD) WITH PROPOFOL;  Surgeon: Elnita MaxwellMatthew Gordon Rein, MD;  [facility];  Service: Endoscopy;  Laterality: N/A;  . INGUINAL HERNIA REPAIR Right 04/07/2016   Procedure: HERNIA REPAIR INGUINAL ADULT;  Surgeon: Kieth BrightlySeeplaputhur G Suleima Ohlendorf, MD;  Location: [facility] ORS;  Service: General;  Laterality: Right;    No family history on file.  Social History Social History  Substance Use Topics  . Smoking status: Never Smoker  . Smokeless tobacco: Never Used  . Alcohol use 0.0 oz/week    Allergies  Allergen Reactions  . Other     GLUTEN INTOLERANCE    Current Outpatient Prescriptions  Medication Sig Dispense Refill  . cholecalciferol (VITAMIN D) 1000 units tablet Take 1,000 Units by mouth daily.    [name]. docusate sodium (COLACE) 100 MG capsule Take 100 mg by mouth 2 (two) times daily.    . ferrous sulfate 325 (65 FE) MG tablet Take 325 mg by mouth 2 (two) times daily with a meal.    . glimepiride (AMARYL) 4 MG tablet Take 4 mg by mouth daily with breakfast.    . lanolin/mineral oil (KERI/THERA-DERM) LOTN Apply 1 application topically as needed for dry skin. (Patient taking differently:  Apply 1 application topically 2 (two) times daily. ) 1 Bottle 6  . loratadine (CLARITIN) 10 MG tablet Take 10 mg by mouth daily.    [name]. losartan (COZAAR) 50 MG tablet Take 50 mg by mouth every morning.     . metFORMIN (GLUCOPHAGE) 1000 MG tablet Take 1,000 mg by mouth 2 (two) times daily.     . pantoprazole (PROTONIX) 40 MG tablet Take 40 mg by mouth 2 (two) times daily.    . vitamin B-12 (CYANOCOBALAMIN) 1000 MCG tablet Take 1,000 mcg by mouth daily.    . vitamin E 400 UNIT capsule Take 400 Units by mouth daily.     Current Facility-Administered Medications  Medication Dose Route Frequency Provider Last Rate Last Dose  . lanolin/mineral oil (KERI/THERA-DERM) lotion   Topical PRN Jairen Goldfarb Wynona LunaG Jamarri Vuncannon, MD        Review of Systems Review of Systems  Constitutional: Negative.   Respiratory: Negative.   Cardiovascular: Negative.     Blood pressure 120/72, pulse 88, resp. rate 18, height 4\' 11"  (1.499 m), weight 94 lb (42.6 kg).  Physical Exam Physical Exam  Constitutional: He appears well-developed and well-nourished.  Abdominal: Soft. Normal appearance. There is no tenderness. No hernia.    Neurological: He is alert.  Skin: Skin is warm and dry.  Psychiatric: His behavior is normal.    Data Reviewed  Prior notes.  Assessment    Infected seroma has healed. Repair is  intact    Plan    Follow up as needed.      This information has been scribed by Dorathy DaftMarsha Hatch RN, BSN,BC.  Agapita Savarino G 05/30/2016, 8:48 AM

## 2016-05-29 NOTE — Patient Instructions (Signed)
The patient is aware to call back for any questions or concerns.  

## 2016-09-03 ENCOUNTER — Other Ambulatory Visit: Payer: Self-pay | Admitting: General Surgery

## 2017-12-04 ENCOUNTER — Emergency Department: Payer: Medicare Other

## 2017-12-04 ENCOUNTER — Encounter: Payer: Self-pay | Admitting: Emergency Medicine

## 2017-12-04 ENCOUNTER — Inpatient Hospital Stay
Admission: EM | Admit: 2017-12-04 | Discharge: 2017-12-08 | DRG: 481 | Disposition: A | Discharge status: Skilled Nursing Facility | Payer: Medicare Other | Attending: Internal Medicine | Admitting: Internal Medicine

## 2017-12-04 ENCOUNTER — Other Ambulatory Visit: Payer: Self-pay

## 2017-12-04 DIAGNOSIS — E871 Hypo-osmolality and hyponatremia: Secondary | ICD-10-CM

## 2017-12-04 DIAGNOSIS — L89151 Pressure ulcer of sacral region, stage 1: Secondary | ICD-10-CM | POA: Diagnosis present

## 2017-12-04 DIAGNOSIS — S72141A Displaced intertrochanteric fracture of right femur, initial encounter for closed fracture: Principal | ICD-10-CM | POA: Diagnosis present

## 2017-12-04 DIAGNOSIS — R339 Retention of urine, unspecified: Secondary | ICD-10-CM | POA: Diagnosis not present

## 2017-12-04 DIAGNOSIS — Z7984 Long term (current) use of oral hypoglycemic drugs: Secondary | ICD-10-CM

## 2017-12-04 DIAGNOSIS — R197 Diarrhea, unspecified: Secondary | ICD-10-CM | POA: Diagnosis not present

## 2017-12-04 DIAGNOSIS — Z8781 Personal history of (healed) traumatic fracture: Secondary | ICD-10-CM

## 2017-12-04 DIAGNOSIS — Z91018 Allergy to other foods: Secondary | ICD-10-CM | POA: Diagnosis not present

## 2017-12-04 DIAGNOSIS — M25551 Pain in right hip: Secondary | ICD-10-CM | POA: Diagnosis not present

## 2017-12-04 DIAGNOSIS — Y92199 Unspecified place in other specified residential institution as the place of occurrence of the external cause: Secondary | ICD-10-CM | POA: Diagnosis not present

## 2017-12-04 DIAGNOSIS — Z419 Encounter for procedure for purposes other than remedying health state, unspecified: Secondary | ICD-10-CM

## 2017-12-04 DIAGNOSIS — F79 Unspecified intellectual disabilities: Secondary | ICD-10-CM | POA: Diagnosis present

## 2017-12-04 DIAGNOSIS — S72001A Fracture of unspecified part of neck of right femur, initial encounter for closed fracture: Secondary | ICD-10-CM

## 2017-12-04 DIAGNOSIS — Z9889 Other specified postprocedural states: Secondary | ICD-10-CM

## 2017-12-04 DIAGNOSIS — E1165 Type 2 diabetes mellitus with hyperglycemia: Secondary | ICD-10-CM | POA: Diagnosis not present

## 2017-12-04 DIAGNOSIS — E860 Dehydration: Secondary | ICD-10-CM | POA: Diagnosis not present

## 2017-12-04 DIAGNOSIS — Z466 Encounter for fitting and adjustment of urinary device: Secondary | ICD-10-CM | POA: Diagnosis not present

## 2017-12-04 DIAGNOSIS — R338 Other retention of urine: Secondary | ICD-10-CM | POA: Diagnosis not present

## 2017-12-04 DIAGNOSIS — W07XXXA Fall from chair, initial encounter: Secondary | ICD-10-CM | POA: Diagnosis present

## 2017-12-04 DIAGNOSIS — E876 Hypokalemia: Secondary | ICD-10-CM | POA: Diagnosis present

## 2017-12-04 DIAGNOSIS — Z79899 Other long term (current) drug therapy: Secondary | ICD-10-CM | POA: Diagnosis not present

## 2017-12-04 DIAGNOSIS — L8990 Pressure ulcer of unspecified site, unspecified stage: Secondary | ICD-10-CM

## 2017-12-04 DIAGNOSIS — I10 Essential (primary) hypertension: Secondary | ICD-10-CM | POA: Diagnosis present

## 2017-12-04 LAB — CBC WITH DIFFERENTIAL/PLATELET
BASOS ABS: 0 10*3/uL (ref 0–0.1)
BASOS PCT: 0 %
EOS ABS: 0 10*3/uL (ref 0–0.7)
EOS PCT: 0 %
HCT: 34.2 % — ABNORMAL LOW (ref 40.0–52.0)
Hemoglobin: 12.1 g/dL — ABNORMAL LOW (ref 13.0–18.0)
Lymphocytes Relative: 4 %
Lymphs Abs: 0.2 10*3/uL — ABNORMAL LOW (ref 1.0–3.6)
MCH: 35.4 pg — ABNORMAL HIGH (ref 26.0–34.0)
MCHC: 35.4 g/dL (ref 32.0–36.0)
MCV: 99.9 fL (ref 80.0–100.0)
MONO ABS: 0.4 10*3/uL (ref 0.2–1.0)
MONOS PCT: 9 %
NEUTROS ABS: 3.8 10*3/uL (ref 1.4–6.5)
Neutrophils Relative %: 87 %
PLATELETS: 210 10*3/uL (ref 150–440)
RBC: 3.42 MIL/uL — ABNORMAL LOW (ref 4.40–5.90)
RDW: 13 % (ref 11.5–14.5)
WBC: 4.4 10*3/uL (ref 3.8–10.6)

## 2017-12-04 LAB — POTASSIUM: Potassium: 2.9 mmol/L — ABNORMAL LOW (ref 3.5–5.1)

## 2017-12-04 LAB — TYPE AND SCREEN
ABO/RH(D): O POS
ANTIBODY SCREEN: NEGATIVE

## 2017-12-04 LAB — GLUCOSE, CAPILLARY
GLUCOSE-CAPILLARY: 233 mg/dL — AB (ref 70–99)
GLUCOSE-CAPILLARY: 262 mg/dL — AB (ref 70–99)
Glucose-Capillary: 179 mg/dL — ABNORMAL HIGH (ref 70–99)
Glucose-Capillary: 203 mg/dL — ABNORMAL HIGH (ref 70–99)

## 2017-12-04 LAB — BASIC METABOLIC PANEL
ANION GAP: 13 (ref 5–15)
BUN: 8 mg/dL (ref 6–20)
CALCIUM: 8.1 mg/dL — AB (ref 8.9–10.3)
CO2: 19 mmol/L — ABNORMAL LOW (ref 22–32)
Chloride: 97 mmol/L — ABNORMAL LOW (ref 98–111)
Creatinine, Ser: 0.73 mg/dL (ref 0.61–1.24)
Glucose, Bld: 300 mg/dL — ABNORMAL HIGH (ref 70–99)
Potassium: 2.5 mmol/L — CL (ref 3.5–5.1)
SODIUM: 129 mmol/L — AB (ref 135–145)

## 2017-12-04 LAB — PROTIME-INR
INR: 1.07
PROTHROMBIN TIME: 13.8 s (ref 11.4–15.2)

## 2017-12-04 LAB — HEMOGLOBIN A1C
HEMOGLOBIN A1C: 7.8 % — AB (ref 4.8–5.6)
Mean Plasma Glucose: 177.16 mg/dL

## 2017-12-04 LAB — APTT: APTT: 29 s (ref 24–36)

## 2017-12-04 LAB — MRSA PCR SCREENING: MRSA by PCR: POSITIVE — AB

## 2017-12-04 MED ORDER — OXYCODONE HCL 5 MG PO TABS
5.0000 mg | ORAL_TABLET | ORAL | Status: DC | PRN
  Administered 2017-12-04 (×2): 5 mg via ORAL
  Filled 2017-12-04 (×2): qty 1

## 2017-12-04 MED ORDER — ONDANSETRON HCL 4 MG PO TABS: 4.0000 mg | ORAL_TABLET | Freq: Four times a day (QID) | ORAL | Status: DC | PRN

## 2017-12-04 MED ORDER — CEFAZOLIN SODIUM-DEXTROSE 2-4 GM/100ML-% IV SOLN
2.0000 g | INTRAVENOUS | Status: AC
  Administered 2017-12-05: 2 g via INTRAVENOUS
  Filled 2017-12-04: qty 100

## 2017-12-04 MED ORDER — HYDRALAZINE HCL 20 MG/ML IJ SOLN: 10.0000 mg | Freq: Four times a day (QID) | INTRAMUSCULAR | Status: DC | PRN

## 2017-12-04 MED ORDER — MUPIROCIN 2 % EX OINT
1.0000 "application " | TOPICAL_OINTMENT | Freq: Two times a day (BID) | CUTANEOUS | Status: DC
  Administered 2017-12-05 – 2017-12-08 (×8): 1 via NASAL
  Filled 2017-12-04: qty 22

## 2017-12-04 MED ORDER — POTASSIUM CHLORIDE 10 MEQ/100ML IV SOLN
10.0000 meq | INTRAVENOUS | Status: AC
Start: 2017-12-04 — End: 2017-12-04
  Administered 2017-12-04 (×2): 10 meq via INTRAVENOUS
  Filled 2017-12-04 (×2): qty 100

## 2017-12-04 MED ORDER — INSULIN ASPART 100 UNIT/ML ~~LOC~~ SOLN
0.0000 [IU] | Freq: Three times a day (TID) | SUBCUTANEOUS | Status: DC
  Administered 2017-12-04 – 2017-12-05 (×2): 8 [IU] via SUBCUTANEOUS
  Administered 2017-12-05 – 2017-12-06 (×4): 5 [IU] via SUBCUTANEOUS
  Administered 2017-12-07: 3 [IU] via SUBCUTANEOUS
  Administered 2017-12-07: 2 [IU] via SUBCUTANEOUS
  Administered 2017-12-07 – 2017-12-08 (×3): 3 [IU] via SUBCUTANEOUS
  Filled 2017-12-04 (×11): qty 1

## 2017-12-04 MED ORDER — POTASSIUM CHLORIDE CRYS ER 20 MEQ PO TBCR
40.0000 meq | EXTENDED_RELEASE_TABLET | ORAL | Status: AC
  Administered 2017-12-04 (×3): 40 meq via ORAL
  Filled 2017-12-04 (×3): qty 2

## 2017-12-04 MED ORDER — CHLORHEXIDINE GLUCONATE CLOTH 2 % EX PADS
6.0000 | MEDICATED_PAD | Freq: Every day | CUTANEOUS | Status: DC
  Administered 2017-12-05 – 2017-12-08 (×4): 6 via TOPICAL

## 2017-12-04 MED ORDER — DOCUSATE SODIUM 100 MG PO CAPS
100.0000 mg | ORAL_CAPSULE | Freq: Two times a day (BID) | ORAL | Status: DC
  Administered 2017-12-04 (×2): 100 mg via ORAL
  Filled 2017-12-04 (×2): qty 1

## 2017-12-04 MED ORDER — ACETAMINOPHEN 325 MG PO TABS
650.0000 mg | ORAL_TABLET | Freq: Four times a day (QID) | ORAL | Status: DC | PRN
  Administered 2017-12-06 – 2017-12-08 (×4): 650 mg via ORAL
  Filled 2017-12-04 (×3): qty 2

## 2017-12-04 MED ORDER — INSULIN ASPART 100 UNIT/ML ~~LOC~~ SOLN
0.0000 [IU] | Freq: Every day | SUBCUTANEOUS | Status: DC
  Administered 2017-12-04 – 2017-12-06 (×3): 2 [IU] via SUBCUTANEOUS
  Filled 2017-12-04 (×3): qty 1

## 2017-12-04 MED ORDER — ACETAMINOPHEN 325 MG PO TABS
650.0000 mg | ORAL_TABLET | Freq: Once | ORAL | Status: AC
  Administered 2017-12-04: 650 mg via ORAL
  Filled 2017-12-04: qty 2

## 2017-12-04 MED ORDER — PANTOPRAZOLE SODIUM 40 MG PO TBEC
40.0000 mg | DELAYED_RELEASE_TABLET | Freq: Two times a day (BID) | ORAL | Status: DC
  Administered 2017-12-04 – 2017-12-08 (×8): 40 mg via ORAL
  Filled 2017-12-04 (×8): qty 1

## 2017-12-04 MED ORDER — INSULIN ASPART 100 UNIT/ML ~~LOC~~ SOLN
0.0000 [IU] | SUBCUTANEOUS | Status: DC
  Administered 2017-12-04: 2 [IU] via SUBCUTANEOUS

## 2017-12-04 MED ORDER — LOSARTAN POTASSIUM 50 MG PO TABS
50.0000 mg | ORAL_TABLET | ORAL | Status: DC
  Administered 2017-12-06 – 2017-12-08 (×3): 50 mg via ORAL
  Filled 2017-12-04 (×3): qty 1

## 2017-12-04 MED ORDER — INSULIN ASPART 100 UNIT/ML ~~LOC~~ SOLN
SUBCUTANEOUS | Status: AC
  Filled 2017-12-04: qty 1

## 2017-12-04 MED ORDER — ACETAMINOPHEN 650 MG RE SUPP: 650.0000 mg | Freq: Four times a day (QID) | RECTAL | Status: DC | PRN

## 2017-12-04 MED ORDER — GLIMEPIRIDE 2 MG PO TABS
2.0000 mg | ORAL_TABLET | Freq: Every day | ORAL | Status: DC
  Administered 2017-12-04 – 2017-12-08 (×4): 2 mg via ORAL
  Filled 2017-12-04 (×5): qty 1

## 2017-12-04 MED ORDER — MORPHINE SULFATE (PF) 2 MG/ML IV SOLN: 2.0000 mg | INTRAVENOUS | Status: DC | PRN

## 2017-12-04 MED ORDER — ALBUTEROL SULFATE (2.5 MG/3ML) 0.083% IN NEBU: 2.5000 mg | INHALATION_SOLUTION | RESPIRATORY_TRACT | Status: DC | PRN

## 2017-12-04 MED ORDER — POLYETHYLENE GLYCOL 3350 17 G PO PACK: 17.0000 g | PACK | Freq: Every day | ORAL | Status: DC | PRN

## 2017-12-04 MED ORDER — ONDANSETRON HCL 4 MG/2ML IJ SOLN: 4.0000 mg | Freq: Four times a day (QID) | INTRAMUSCULAR | Status: DC | PRN

## 2017-12-04 NOTE — H&P (Signed)
[facility] - [facility]   PATIENT NAME: Reginald Hall    MR#:  [id number]  DATE OF BIRTH:  November 17, 1965  DATE OF ADMISSION:  12/04/2017  PRIMARY CARE PHYSICIAN: Derwood Kaplan, MD   REQUESTING/REFERRING PHYSICIAN: Dr. Fanny Bien  CHIEF COMPLAINT:   Chief Complaint  Patient presents with  . Fall    HISTORY OF PRESENT ILLNESS:  Reginald Hall  is a 52 y.o. male with a known history of hypertension, diabetes, cognitive impairment presents from a group home after having a mechanical fall.  Patient started complaining of right hip pain and was brought to the emergency room.  He has right intertrochanteric hip fracture and is being admitted to the hospital for surgery and further management. Patient due to his cognitive impairment is unable to contribute to history.  Old records reviewed.  Discussed with ED staff.  PAST MEDICAL HISTORY:   Past Medical History:  Diagnosis Date  . Diabetes mellitus without complication [facility])    Patient takes Metformin  . Hypertension   . Hyponatremia   . Mental retardation     PAST SURGICAL HISTORY:   Past Surgical History:  Procedure Laterality Date  . ESOPHAGOGASTRODUODENOSCOPY (EGD) WITH PROPOFOL N/A 03/23/2015   Procedure: ESOPHAGOGASTRODUODENOSCOPY (EGD) WITH PROPOFOL;  Surgeon: [name], MD;  Location: [facility] ENDOSCOPY;  Service: Endoscopy;  Laterality: N/A;  . INGUINAL HERNIA REPAIR Right 04/07/2016   Procedure: HERNIA REPAIR INGUINAL ADULT;  Surgeon: Kieth Brightly, MD;  Location: [facility] ORS;  Service: General;  Laterality: Right;    SOCIAL HISTORY:   Social History   Tobacco Use  . Smoking status: Never Smoker  . Smokeless tobacco: Never Used  Substance Use Topics  . Alcohol use: Yes    Alcohol/week: 0.0 oz    FAMILY HISTORY:  No family history on file.  DRUG ALLERGIES:   Allergies  Allergen Reactions  . Other     GLUTEN INTOLERANCE    REVIEW OF SYSTEMS:   Review of Systems  Unable  to perform ROS: Mental acuity    MEDICATIONS AT HOME:   Prior to Admission medications   Medication Sig Start Date End Date Taking? Authorizing Provider  cholecalciferol (VITAMIN D) 1000 units tablet Take 1,000 Units by mouth daily.   Yes [provider]  docusate sodium (COLACE) 100 MG capsule Take 100 mg by mouth 2 (two) times daily.   Yes [provider]  ferrous sulfate 325 (65 FE) MG tablet Take 325 mg by mouth 2 (two) times daily with a meal.   Yes [provider]  glimepiride (AMARYL) 4 MG tablet Take 4 mg by mouth daily with breakfast.   Yes [provider]  loratadine (CLARITIN) 10 MG tablet Take 10 mg by mouth daily.   Yes [provider]  losartan (COZAAR) 50 MG tablet Take 50 mg by mouth every morning.    Yes [provider]  metFORMIN (GLUCOPHAGE) 1000 MG tablet Take 1,000 mg by mouth 2 (two) times daily.    Yes [provider]  pantoprazole (PROTONIX) 40 MG tablet Take 40 mg by mouth 2 (two) times daily.   Yes [provider]  vitamin B-12 (CYANOCOBALAMIN) 1000 MCG tablet Take 1,000 mcg by mouth daily.   Yes [provider]  vitamin E 400 UNIT capsule Take 400 Units by mouth daily.   Yes [provider]  Skin Protectants, Misc. (EUCERIN) cream APPLY TOPICALLY AS NEEDED FOR DRY SKIN. 09/03/16   Kieth Brightly, MD  VITAL SIGNS:  Blood pressure 109/62, pulse 96, temperature 98.8 F (37.1 C), temperature source Oral, resp. rate 16, weight 42.6 kg (94 lb), SpO2 96 %.  PHYSICAL EXAMINATION:  Physical Exam  GENERAL:  52 y.o.-year-old patient lying in the bed with no acute distress.  EYES: Pupils equal, round, reactive to light and accommodation. No scleral icterus. Extraocular muscles intact.  HEENT: Head atraumatic, normocephalic. Oropharynx and nasopharynx clear. No oropharyngeal erythema, moist oral mucosa  NECK:  Supple, no jugular venous distention. No thyroid enlargement, no  tenderness.  LUNGS: Normal breath sounds bilaterally, no wheezing, rales, rhonchi. No use of accessory muscles of respiration.  CARDIOVASCULAR: S1, S2 normal. No murmurs, rubs, or gallops.  ABDOMEN: Soft, nontender, nondistended. Bowel sounds present. No organomegaly or mass.  EXTREMITIES: No pedal edema, cyanosis, or clubbing.  Right hip tenderness NEUROLOGIC: Cranial nerves II through XII are intact. No focal Motor or sensory deficits appreciated b/l PSYCHIATRIC: The patient is alert and awake.  Pleasantly confused SKIN: No obvious rash, lesion, or ulcer.   LABORATORY PANEL:   CBC Recent Labs  Lab 12/04/17 1026  WBC 4.4  HGB 12.1*  HCT 34.2*  PLT 210   ------------------------------------------------------------------------------------------------------------------  Chemistries  No results for input(s): NA, K, CL, CO2, GLUCOSE, BUN, CREATININE, CALCIUM, MG, AST, ALT, ALKPHOS, BILITOT in the last 168 hours.  Invalid input(s): GFRCGP ------------------------------------------------------------------------------------------------------------------  Cardiac Enzymes No results for input(s): TROPONINI in the last 168 hours. ------------------------------------------------------------------------------------------------------------------  RADIOLOGY:  Dg Hip Unilat W Or Wo Pelvis 2-3 Views Right  Result Date: 12/04/2017 CLINICAL DATA:  Acute RIGHT hip pain following fall today. Initial encounter. EXAM: DG HIP (WITH OR WITHOUT PELVIS) 2-3V RIGHT COMPARISON:  None. FINDINGS: An intertrochanteric RIGHT femur fracture is noted with valgus angulation. No definite displacement on these views. No other fracture, subluxation or dislocation noted. No suspicious focal bony lesions are present. IMPRESSION: Intertrochanteric RIGHT femur fracture with valgus angulation. Electronically Signed   By: Harmon PierJeffrey  Hu M.D.   On: 12/04/2017 09:59   IMPRESSION AND PLAN:   *Intertrochanteric right femur  fracture Discussed with Dr. Martha ClanKrasinski of orthopedics.  Surgery tomorrow morning. Pain medications added. N.p.o. after midnight Bedrest  *Severe hypokalemia.  Will replace aggressively through IV and oral.  Telemetry monitoring till potassium corrects.  Repeat potassium level in the evening.  *Diabetes mellitus.  Continue home medications.  Sliding scale insulin.  *Chronic mild hyponatremia.  Monitor  *Hypertension.  Continue medications.  Hydralazine IV as needed added.  DVT prophylaxis with SCDs.  Chemical prophylaxis after surgery per orthopedics.  All the records are reviewed and case discussed with ED provider. Management plans discussed with the patient, family and they are in agreement.  CODE STATUS: Full code  TOTAL TIME TAKING CARE OF THIS PATIENT: 40 minutes.   Molinda BailiffSrikar R Dynasia Kercheval M.D on 12/04/2017 at 11:39 AM  Between 7am to 6pm - Pager - [phone]  After 6pm go to [email or web address] - password [email or web address] [facility]  [facility] [place] [facility]  Office  [phone]  CC: Primary care physician; Derwood KaplanEason, Ernest B, MD  Note: This dictation was prepared with Dragon dictation along with smaller phrase technology. Any transcriptional errors that result from this process are unintentional.

## 2017-12-04 NOTE — Clinical Social Work Note (Signed)
Clinical Social Work Assessment  Patient Details  Name: Reginald Hall MRN: [id number] Date of Birth: 07/12/1965  Date of referral:  12/04/17               Reason for consult:  Facility Placement                Permission sought to share information with:  Oceanographeracility Contact Representative Permission granted to share information::  Yes, Verbal Permission Granted  Name::      Skilled Nursing Facility   Agency::   [name]   Relationship::     Contact Information:     Housing/Transportation Living arrangements for the past 2 months:  Assisted Living Facility Source of Information:  Guardian Patient Interpreter Needed:  None Criminal Activity/Legal Involvement Pertinent to Current Situation/Hospitalization:  No - Comment as needed Significant Relationships:  Other(Comment)(DSS guardian) Lives with:  Siblings Do you feel safe going back to the place where you live?    Need for family participation in patient care:  Yes (Comment)  Care giving concerns:  [facility] [place]. Patient is a ward of the state and [facility] [place] DSS has guardianship.    Social Worker assessment / plan:  Visual merchandiserClinical Social Worker (CSW) reviewed chart and noted that patient has a hip fracture. Per chart patient has a DSS guardian. CSW contacted Kylie with [facility] who stated that patient is a ward of the state and assigned social worker is [employer]bonie White [phone]. Per Kylie over the weekend the doctors and nurses can contact Andree MoroLatonya Benton with DSS [phone] to get consent for surgery. CSW made Kylie aware that patient will likely need to go to SNF for short term rehab after surgery. Jenel LucksKylie is agreeable to SNF search in [place]. FL2 complete and faxed out. PASARR is pending. CSW will continue to follow and assist as needed.   Employment status:  Disabled (Comment on whether or not currently receiving Disability) Insurance information:   Medicare, [place] Recommend[place]:  Not assessed at this time Information / Referral to community resources:  Skilled Nursing Facility  Patient/Family's Response to care:  DSS guardian is agreeable to [place].   Patient/Family's Understanding of and Emotional Response to Diagnosis, Current Treatment, and Prognosis:  DSS guardian is agreeable to surgery and SNF placement.   Emotional Assessment Appearance:  Appears stated age Attitude/Demeanor/Rapport:  Unable to Assess Affect (typically observed):  Unable to Assess Orientation:  Oriented to Self, Oriented to Place, Fluctuating Orientation (Suspected and/or reported Sundowners) Alcohol / Substance use:  Not Applicable Psych involvement (Current and /or in the community):  No (Comment)  Discharge Needs  Concerns to be addressed:  Discharge Planning Concerns Readmission within the last 30 days:  No Current discharge risk:  Dependent with Mobility, Cognitively Impaired, Chronically ill Barriers to Discharge:  Continued Medical Work up   [employer], Darleen CrockerBailey M, LCSW 12/04/2017, 4:38 PM

## 2017-12-04 NOTE — Progress Notes (Signed)
Unable to insert 1314fr catheter with resistance.

## 2017-12-04 NOTE — NC FL2 (Signed)
[place] MEDICAID FL2 LEVEL OF CARE SCREENING TOOL     IDENTIFICATION  Patient Name: Reginald Hall Birthdate: 03-10-1966 Sex: male Admission Date (Current Location): 12/04/2017  [facility] and [place] Number:  [address] ([phone]) Facility and Address:  [facility], [address] [place], [place] [address]      Provider Number: [id number]  Attending Physician Name and Address:  Milagros Loll, MD  Relative Name and Phone Number:       Current Level of Care: Hospital Recommended Level of Care: Skilled Nursing Facility Prior Approval Number:    Date Approved/Denied:   PASRR Number:    Discharge Plan: SNF    Current Diagnoses: Patient Active Problem List   Diagnosis Date Noted  . Closed right hip fracture (HCC) 12/04/2017    Orientation RESPIRATION BLADDER Height & Weight     Self, Place  Normal Continent Weight: 90 lb (40.8 kg) Height:     BEHAVIORAL SYMPTOMS/MOOD NEUROLOGICAL BOWEL NUTRITION STATUS      Continent Diet(Diet: Carb Modified. )  AMBULATORY STATUS COMMUNICATION OF NEEDS Skin   Extensive Assist Verbally Surgical wounds, PU Stage and Appropriate Care(pressure ulcer stage 1 on sacrum. )                       Personal Care Assistance Level of Assistance  Bathing, Feeding, Dressing Bathing Assistance: Limited assistance Feeding assistance: Independent Dressing Assistance: Limited assistance     Functional Limitations Info  Sight, Hearing, Speech Sight Info: Adequate Hearing Info: Adequate Speech Info: Adequate    SPECIAL CARE FACTORS FREQUENCY  PT (By licensed PT), OT (By licensed OT)     PT Frequency: (5) OT Frequency: (5)            Contractures      Additional Factors Info  Code Status, Isolation Precautions Code Status Info: (Full Code. )       Isolation Precautions Info: (MRSA Nasal Swab. )     Current Medications (12/04/2017):  This is the current hospital active medication list Current  Facility-Administered Medications  Medication Dose Route Frequency Provider Last Rate Last Dose  . acetaminophen (TYLENOL) tablet 650 mg  650 mg Oral Q6H PRN Milagros Loll, MD       Or  . acetaminophen (TYLENOL) suppository 650 mg  650 mg Rectal Q6H PRN Sudini, Srikar, MD      . albuterol (PROVENTIL) (2.5 MG/3ML) 0.083% nebulizer solution 2.5 mg  2.5 mg Nebulization Q2H PRN Sudini, Wardell Heath, MD      . Melene Muller ON 12/05/2017] Chlorhexidine Gluconate Cloth 2 % PADS 6 each  6 each Topical Q0600 Juanell Fairly, MD      . docusate sodium (COLACE) capsule 100 mg  100 mg Oral BID Milagros Loll, MD   100 mg at 12/04/17 1358  . glimepiride (AMARYL) tablet 2 mg  2 mg Oral Q breakfast Milagros Loll, MD   2 mg at 12/04/17 1600  . hydrALAZINE (APRESOLINE) injection 10 mg  10 mg Intravenous Q6H PRN Sudini, Srikar, MD      . insulin aspart (novoLOG) 100 UNIT/ML injection           . insulin aspart (novoLOG) injection 0-15 Units  0-15 Units Subcutaneous TID WC Sudini, Srikar, MD      . insulin aspart (novoLOG) injection 0-5 Units  0-5 Units Subcutaneous QHS Sudini, Wardell Heath, MD      . Melene Muller ON 12/05/2017] losartan (COZAAR) tablet 50 mg  50 mg Oral Evern Bio, MD      .  morphine 2 MG/ML injection 2 mg  2 mg Intravenous Q4H PRN Sudini, Wardell HeathSrikar, MD      . mupirocin ointment (BACTROBAN) 2 % 1 application  1 application Nasal BID Juanell FairlyKrasinski, Kevin, MD      . ondansetron [facility](ZOFRAN) tablet 4 mg  4 mg Oral Q6H PRN Milagros LollSudini, Srikar, MD       Or  . ondansetron (ZOFRAN) injection 4 mg  4 mg Intravenous Q6H PRN Sudini, Wardell HeathSrikar, MD      . oxyCODONE (Oxy IR/ROXICODONE) immediate release tablet 5 mg  5 mg Oral Q4H PRN Milagros LollSudini, Srikar, MD   5 mg at 12/04/17 1358  . pantoprazole (PROTONIX) EC tablet 40 mg  40 mg Oral BID Milagros LollSudini, Srikar, MD   40 mg at 12/04/17 1358  . polyethylene glycol (MIRALAX / GLYCOLAX) packet 17 g  17 g Oral Daily PRN Sudini, Srikar, MD      . potassium chloride SA (K-DUR,KLOR-CON) CR tablet 40 mEq  40  mEq Oral Q4H Milagros LollSudini, Srikar, MD   40 mEq at 12/04/17 1601     Discharge Medications: Please see discharge summary for a list of discharge medications.  Relevant Imaging Results:  Relevant Lab Results:   Additional Information (SSN: [id number])  Ailis Rigaud, Darleen CrockerBailey M, LCSW

## 2017-12-04 NOTE — ED Triage Notes (Signed)
Pt to ED via [facility] for fall this morning. Pt is in NAD at this time. C/o pain in the right leg.

## 2017-12-04 NOTE — ED Notes (Signed)
Report called to receiving RN and pt updated with plan of care. VSS. 

## 2017-12-04 NOTE — ED Provider Notes (Signed)
[facility] Emergency Department Provider Note  Unable to reach [name] (number listed is disconnected)  [phone]  Care home advises to call [name] (who is brother, patient's mom has since become deceased) and they advise that his POA is now his brother [name], can be reached at [phone] and social services is also responsible (Rhesa Lafayette Dragon [phone])   ____________________________________________   First MD Initiated Contact with Patient 12/04/17 [phone]     (approximate)  I have reviewed the triage vital signs and the nursing notes.   HISTORY  Chief Complaint Fall  EM caveat: Some limitation due to history of mental retardation and some cognitive impairment  HPI Reginald Hall is a 52 y.o. male who reportedly fell, was seated at table fell onto the floor striking his right hip with pain in the right hip.  Patient reports some points towards his right hip and denies any other pain.  EMS reports there was no loss of consciousness or other known injury.  Patient reports that he is having pain in the right hip.  Describes as mild and worse when moved.  Denies headache no neck pain.  No nausea or vomiting.  No chest pain or trouble breathing.  No pain over the ribs.  No numbness or weakness in the foot, but having pain pointing towards his right hip and pelvis   Past Medical History:  Diagnosis Date  . Diabetes mellitus without complication [facility])    Patient takes Metformin  . Hypertension   . Hyponatremia   . Mental retardation     Patient Active Problem List   Diagnosis Date Noted  . Closed right hip fracture (HCC) 12/04/2017    Past Surgical History:  Procedure Laterality Date  . ESOPHAGOGASTRODUODENOSCOPY (EGD) WITH PROPOFOL N/A 03/23/2015   Procedure: ESOPHAGOGASTRODUODENOSCOPY (EGD) WITH PROPOFOL;  Surgeon: [name], MD;  Location: [facility] ENDOSCOPY;  Service: Endoscopy;  Laterality: N/A;  . INGUINAL HERNIA REPAIR Right  04/07/2016   Procedure: HERNIA REPAIR INGUINAL ADULT;  Surgeon: Kieth Brightly, MD;  Location: [facility] ORS;  Service: General;  Laterality: Right;    Prior to Admission medications   Medication Sig Start Date End Date Taking? Authorizing Provider  cholecalciferol (VITAMIN D) 1000 units tablet Take 1,000 Units by mouth daily.   Yes [provider]  docusate sodium (COLACE) 100 MG capsule Take 100 mg by mouth 2 (two) times daily.   Yes [provider]  ferrous sulfate 325 (65 FE) MG tablet Take 325 mg by mouth 2 (two) times daily with a meal.   Yes [provider]  glimepiride (AMARYL) 4 MG tablet Take 4 mg by mouth daily with breakfast.   Yes [provider]  loratadine (CLARITIN) 10 MG tablet Take 10 mg by mouth daily.   Yes [provider]  losartan (COZAAR) 50 MG tablet Take 50 mg by mouth every morning.    Yes [provider]  metFORMIN (GLUCOPHAGE) 1000 MG tablet Take 1,000 mg by mouth 2 (two) times daily.    Yes [provider]  pantoprazole (PROTONIX) 40 MG tablet Take 40 mg by mouth 2 (two) times daily.   Yes [provider]  vitamin B-12 (CYANOCOBALAMIN) 1000 MCG tablet Take 1,000 mcg by mouth daily.   Yes [provider]  vitamin E 400 UNIT capsule Take 400 Units by mouth daily.   Yes [provider]  Skin Protectants, Misc. (EUCERIN) cream APPLY TOPICALLY AS NEEDED FOR DRY SKIN. 09/03/16  Kieth BrightlySankar, Seeplaputhur G, MD    Allergies Other  No family history on file.  Social History Social History   Tobacco Use  . Smoking status: Never Smoker  . Smokeless tobacco: Never Used  Substance Use Topics  . Alcohol use: Yes    Alcohol/week: 0.0 oz  . Drug use: No    Review of Systems Constitutional: No fever/chills no weakness ENT: No neck pain Cardiovascular: Denies chest pain. Respiratory: Denies shortness of breath. Gastrointestinal: No abdominal pain.   Musculoskeletal: Negative for  back pain. Skin: Negative for rash. Neurological: Negative for headaches, focal weakness or numbness.    ____________________________________________   PHYSICAL EXAM:  VITAL SIGNS: ED Triage Vitals [12/04/17 0921]  Enc Vitals Group     BP 105/61     Pulse Rate 100     Resp 16     Temp 98.8 F (37.1 C)     Temp Source Oral     SpO2 96 %     Weight 94 lb (42.6 kg)     Height      Head Circumference      Peak Flow      Pain Score      Pain Loc      Pain Edu?      Excl. in GC?     Constitutional: Alert and oriented to self, situation but not to year. Well appearing and in no acute distress.  He is very pleasant. Eyes: Conjunctivae are normal. Head: Atraumatic. Nose: No congestion/rhinnorhea. Mouth/Throat: Mucous membranes are moist. Neck: No stridor.  No cervical thoracic or lumbar tenderness.  Full range of motion neck without pain Cardiovascular: Normal rate, regular rhythm. Grossly normal heart sounds.  Good peripheral circulation. Respiratory: Normal respiratory effort.  No retractions. Lungs CTAB. Gastrointestinal: Soft and nontender. No distention. Musculoskeletal:   RIGHT Right upper extremity demonstrates normal strength, good use of all muscles. No edema bruising or contusions of the right shoulder/upper arm, right elbow, right forearm / hand. Full range of motion of the right right upper extremity without pain. No evidence of trauma. Strong radial pulse. Intact median/ulnar/radial neuro-muscular exam.  LEFT Left upper extremity demonstrates normal strength, good use of all muscles. No edema bruising or contusions of the left shoulder/upper arm, left elbow, left forearm / hand. Full range of motion of the left  upper extremity without pain. No evidence of trauma. Strong radial pulse. Intact median/ulnar/radial neuro-muscular exam.  Lower Extremities  No edema. Normal DP/PT pulses bilateral with good cap refill.  Normal neuro-motor function lower extremities  bilateral.  RIGHT Right lower extremity demonstrates normal strength, good use of all muscles except some limitation with movement at the right hip as reports is painful to move pointing at his right lateral hip. No edema bruising or contusions of the right right knee, right ankle. [name]. No pain on axial loading.   LEFT Left lower extremity demonstrates normal strength, good use of all muscles. No edema bruising or contusions of the hip,  knee, ankle. Full range of motion of the left lower extremity without pain. No pain on axial loading. No evidence of trauma.   Neurologic:  Normal speech and language. No gross focal neurologic deficits are appreciated.  Skin:  Skin is warm, dry and intact. No rash noted. Psychiatric: Mood and affect are normal. Speech and behavior are normal.  ____________________________________________   LABS (all labs ordered are listed, but only abnormal results are displayed)  Labs Reviewed  CBC WITH DIFFERENTIAL/PLATELET - Abnormal; Notable for  the following components:      Result Value   RBC 3.42 (*)    Hemoglobin 12.1 (*)    HCT 34.2 (*)    MCH 35.4 (*)    Lymphs Abs 0.2 (*)    All other components within normal limits  BASIC METABOLIC PANEL - Abnormal; Notable for the following components:   Sodium 129 (*)    Potassium 2.5 (*)    Chloride 97 (*)    CO2 19 (*)    Glucose, Bld 300 (*)    Calcium 8.1 (*)    All other components within normal limits  HEMOGLOBIN A1C  HIV ANTIBODY (ROUTINE TESTING)  POTASSIUM  TYPE AND SCREEN   ____________________________________________  EKG  Reviewed enterotomy at 10:30 AM Heart rate 90 QRS 100 QTc 440 Some slightly discernible low amplitude P waves are seen, does not appear consistent with A. fib.  Appears normal sinus, potentially occasional PAC.  Low amplitude P waves. ____________________________________________  RADIOLOGY  Dg Hip Unilat W Or Wo Pelvis 2-3 Views Right  Result Date: 12/04/2017 CLINICAL  DATA:  Acute RIGHT hip pain following fall today. Initial encounter. EXAM: DG HIP (WITH OR WITHOUT PELVIS) 2-3V RIGHT COMPARISON:  None. FINDINGS: An intertrochanteric RIGHT femur fracture is noted with valgus angulation. No definite displacement on these views. No other fracture, subluxation or dislocation noted. No suspicious focal bony lesions are present. IMPRESSION: Intertrochanteric RIGHT femur fracture with valgus angulation. Electronically Signed   By: Harmon Pier M.D.   On: 12/04/2017 09:59    ____________________________________________   PROCEDURES  Procedure(s) performed: None  Procedures  Critical Care performed: No  ____________________________________________   INITIAL IMPRESSION / ASSESSMENT AND PLAN / ED COURSE  Pertinent labs & imaging results that were available during my care of the patient were reviewed by me and considered in my medical decision making (see chart for details).  Reported mechanical fall.  No evidence of head trauma.  No evidence of spinal injury.  Denies and no evidence on exam of thoracic or abdominal trauma.  Does have isolated tenderness and pain over the right lateral hip joint.  ----------------------------------------- 10:37 AM on 12/04/2017 -----------------------------------------  Case discussed with Dr. Jones Bales, will provide consultation from orthopedics.  Also discussed with [facility] Department of Social Services case manager R. Lafayette Dragon, previous understanding of plan to admit for further evaluation and treatment planning.  Patient resting comfortably at this time, reports he is not having much pain at all, does not appear to be in pain he is asking for coffee, however discussed with him he can have anything to eat or drink at this time.  Will await CBC and BMP   Discussed with Dr. Elisabeth Pigeon, will be admitted to hospitalist service with orthopedic consultation.     ----------------------------------------- 12:11 PM on  12/04/2017 -----------------------------------------  Patient be admitted for further work-up and evaluation. ____________________________________________   FINAL CLINICAL IMPRESSION(S) / ED DIAGNOSES  Final diagnoses:  Closed right hip fracture, initial encounter (HCC)  Hypokalemia  Hyponatremia      NEW MEDICATIONS STARTED DURING THIS VISIT:  New Prescriptions   No medications on file     Note:  This document was prepared using Dragon voice recognition software and may include unintentional dictation errors.     Sharyn Creamer, MD 12/04/17 [phone]

## 2017-12-04 NOTE — Progress Notes (Addendum)
Pt admitted to room 150 from the ED. Pt is very pleasant and is A&Ox3, he does have MR. Pt has stage 1 pressure injury on sacrum-sacral foam applied. Tele applied.  Pt oriented to room. [facility] and stated that Reginald Hall is pt's guardian [phone]).  Reginald Hall, Reginald Hall

## 2017-12-04 NOTE — Consult Note (Signed)
ORTHOPAEDIC CONSULTATION  REQUESTING PHYSICIAN: Milagros LollSudini, Srikar, MD  Chief Complaint: right hip pain  HPI: Reginald Hall is a 52 y.o. diabetic male with cognitive impairment who complains of right hip pain status post mechanical fall at home.  Patient was diagnosed with an intertrochanteric right hip fracture by x-ray in the emergency department.  Orthopedics is consulted for management of the fracture.  Patient is a ward of the state.  Past Medical History:  Diagnosis Date  . Diabetes mellitus without complication [facility](HCC)    Patient takes Metformin  . Hypertension   . Hyponatremia   . Mental retardation    Past Surgical History:  Procedure Laterality Date  . ESOPHAGOGASTRODUODENOSCOPY (EGD) WITH PROPOFOL N/A 03/23/2015   Procedure: ESOPHAGOGASTRODUODENOSCOPY (EGD) WITH PROPOFOL;  Surgeon: Elnita MaxwellMatthew Gordon Rein, MD;  [facility];  Service: Endoscopy;  Laterality: N/A;  . INGUINAL HERNIA REPAIR Right 04/07/2016   Procedure: HERNIA REPAIR INGUINAL ADULT;  Surgeon: Kieth BrightlySeeplaputhur G Sankar, MD;  Location: [facility] ORS;  Service: General;  Laterality: Right;   Social History   Socioeconomic History  . Marital status: Single    Spouse name: Not on file  . Number of children: Not on file  . Years of education: Not on file  . Highest education level: Not on file  Occupational History  . Not on file  Social Needs  . Financial resource strain: Not on file  . Food insecurity:    Worry: Not on file    Inability: Not on file  . Transportation needs:    Medical: Not on file    Non-medical: Not on file  Tobacco Use  . Smoking status: Never Smoker  . Smokeless tobacco: Never Used  Substance and Sexual Activity  . Alcohol use: Yes    Alcohol/week: 0.0 oz  . Drug use: No  . Sexual activity: Not on file  Lifestyle  . Physical activity:    Days per week: Not on file    Minutes per session: Not on file  . Stress: Not on file  Relationships  . Social connections:    Talks on  phone: Not on file    Gets together: Not on file    Attends religious service: Not on file    Active member of club or organization: Not on file    Attends meetings of clubs or organizations: Not on file    Relationship status: Not on file  Other Topics Concern  . Not on file  Social History Narrative  . Not on file   History reviewed. No pertinent family history. Allergies  Allergen Reactions  . Other     GLUTEN INTOLERANCE   Prior to Admission medications   Medication Sig Start Date End Date Taking? Authorizing Provider  cholecalciferol (VITAMIN D) 1000 units tablet Take 1,000 Units by mouth daily.   Yes [provider]  docusate sodium (COLACE) 100 MG capsule Take 100 mg by mouth 2 (two) times daily.   Yes [provider]  ferrous sulfate 325 (65 FE) MG tablet Take 325 mg by mouth 2 (two) times daily with a meal.   Yes [provider]  glimepiride (AMARYL) 4 MG tablet Take 4 mg by mouth daily with breakfast.   Yes [provider]  loratadine (CLARITIN) 10 MG tablet Take 10 mg by mouth daily.   Yes [provider]  losartan (COZAAR) 50 MG tablet Take 50 mg by mouth every morning.    Yes [provider]  metFORMIN (GLUCOPHAGE) 1000 MG  tablet Take 1,000 mg by mouth 2 (two) times daily.    Yes [provider]  pantoprazole (PROTONIX) 40 MG tablet Take 40 mg by mouth 2 (two) times daily.   Yes [provider]  vitamin B-12 (CYANOCOBALAMIN) 1000 MCG tablet Take 1,000 mcg by mouth daily.   Yes [provider]  vitamin E 400 UNIT capsule Take 400 Units by mouth daily.   Yes [provider]  Skin Protectants, Misc. (EUCERIN) cream APPLY TOPICALLY AS NEEDED FOR DRY SKIN. 09/03/16   Kieth Brightly, MD   Dg Hip Unilat W Or Wo Pelvis 2-3 Views Right  Result Date: 12/04/2017 CLINICAL DATA:  Acute RIGHT hip pain following fall today. Initial encounter. EXAM: DG HIP (WITH OR WITHOUT PELVIS) 2-3V RIGHT  COMPARISON:  None. FINDINGS: An intertrochanteric RIGHT femur fracture is noted with valgus angulation. No definite displacement on these views. No other fracture, subluxation or dislocation noted. No suspicious focal bony lesions are present. IMPRESSION: Intertrochanteric RIGHT femur fracture with valgus angulation. Electronically Signed   By: Harmon Pier M.D.   On: 12/04/2017 09:59    Positive ROS: All other systems have been reviewed and were otherwise negative with the exception of those mentioned in the HPI and as above.  Physical Exam: General: Alert, no acute distress  MUSCULOSKELETAL: Right lower extremity: Skin is intact.  No erythema ecchymosis seen.  No significant swelling of the right thigh.  Leg and thigh compartments are soft and compressible.  No shortening or external rotation observed.  Patient with palpable pedal pulses, intact sensation light touch and intact motor function distally.  Assessment: Right trochanteric hip fracture  Plan: Based on the patient's x-rays, I would recommend intramedullary fixation for this fracture.  Consent will need to be obtained from DSS.  Orthopedic orders are placed.  Patient will be n.p.o. after midnight.  Patient will not receive anticoagulation overnight in preparation for surgery tomorrow.  Continue current pain management.  Patient is comfortable and in no acute distress.    Juanell Fairly, MD    12/04/2017 7:48 PM

## 2017-12-04 NOTE — ED Notes (Signed)
MD Sudini aware of K 2.5 and to write new orders.

## 2017-12-04 NOTE — Clinical Social Work Placement (Signed)
   CLINICAL SOCIAL WORK PLACEMENT  NOTE  Date:  12/04/2017  Patient Details  Name: Reginald Hall MRN: [id number] Date of Birth: 10/12/1965  Clinical Social Work is seeking post-discharge placement for this patient at the Skilled  Nursing Facility level of care (*CSW will initial, date and re-position this form in  chart as items are completed):  Yes   Patient/family provided with [place] Clinical Social Work Department's list of facilities offering this level of care within the geographic area requested by the patient (or if unable, by the patient's family).  Yes   Patient/family informed of their freedom to choose among providers that offer the needed level of care, that participate in Medicare, Medicaid or managed care program needed by the patient, have an available bed and are willing to accept the patient.  Yes   Patient/family informed of [facility]'s ownership interest in [facility] and [facility], as well as of the fact that they are under no obligation to receive care at these facilities.  PASRR submitted to EDS on 12/04/17     PASRR number received on       Existing PASRR number confirmed on       FL2 transmitted to all facilities in geographic area requested by pt/family on 12/04/17     FL2 transmitted to all facilities within larger geographic area on       Patient informed that his/her managed care company has contracts with or will negotiate with certain facilities, including the following:            Patient/family informed of bed offers received.  Patient chooses bed at       Physician recommends and patient chooses bed at      Patient to be transferred to   on  .  Patient to be transferred to facility by       Patient family notified on   of transfer.  Name of family member notified:        PHYSICIAN       Additional Comment:    _______________________________________________ Mayce Noyes, Darleen CrockerBailey M, LCSW 12/04/2017, 4:36 PM

## 2017-12-04 NOTE — ED Notes (Signed)
Assumed care of pt who is resting on stretcher with even/unlabored respirations. VSS. Pt updated with plan of care. Call bell within reach, will continue to monitor.

## 2017-12-04 NOTE — Progress Notes (Signed)
Pt running afib on telemetry, pt has no history of afib that this RN is aware of. Dr. Elpidio AnisSudini notified; stated that there was concern for afib in the ED, however EKG showed P wave. Received order for EKG-placed in chart.   [place], Latricia HeftKorie G

## 2017-12-05 ENCOUNTER — Inpatient Hospital Stay: Payer: Medicare Other | Admitting: Anesthesiology

## 2017-12-05 ENCOUNTER — Encounter
Admission: EM | Disposition: A | Discharge status: Skilled Nursing Facility | Payer: Self-pay | Source: Home / Self Care | Attending: Internal Medicine

## 2017-12-05 ENCOUNTER — Encounter: Payer: Self-pay | Admitting: *Deleted

## 2017-12-05 ENCOUNTER — Inpatient Hospital Stay: Payer: Medicare Other

## 2017-12-05 DIAGNOSIS — Z466 Encounter for fitting and adjustment of urinary device: Secondary | ICD-10-CM | POA: Diagnosis not present

## 2017-12-05 DIAGNOSIS — R338 Other retention of urine: Secondary | ICD-10-CM | POA: Diagnosis not present

## 2017-12-05 HISTORY — PX: INTRAMEDULLARY (IM) NAIL INTERTROCHANTERIC: SHX5875

## 2017-12-05 HISTORY — PX: CYSTOSCOPY: SHX5120

## 2017-12-05 LAB — BASIC METABOLIC PANEL
ANION GAP: 8 (ref 5–15)
BUN: 11 mg/dL (ref 6–20)
CO2: 20 mmol/L — ABNORMAL LOW (ref 22–32)
Calcium: 8.2 mg/dL — ABNORMAL LOW (ref 8.9–10.3)
Chloride: 103 mmol/L (ref 98–111)
Creatinine, Ser: 0.57 mg/dL — ABNORMAL LOW (ref 0.61–1.24)
GLUCOSE: 190 mg/dL — AB (ref 70–99)
POTASSIUM: 3.2 mmol/L — AB (ref 3.5–5.1)
Sodium: 131 mmol/L — ABNORMAL LOW (ref 135–145)

## 2017-12-05 LAB — CBC
HEMATOCRIT: 34.3 % — AB (ref 40.0–52.0)
HEMOGLOBIN: 12.3 g/dL — AB (ref 13.0–18.0)
MCH: 35.8 pg — ABNORMAL HIGH (ref 26.0–34.0)
MCHC: 35.8 g/dL (ref 32.0–36.0)
MCV: 100.1 fL — ABNORMAL HIGH (ref 80.0–100.0)
Platelets: 224 10*3/uL (ref 150–440)
RBC: 3.42 MIL/uL — AB (ref 4.40–5.90)
RDW: 13 % (ref 11.5–14.5)
WBC: 5.3 10*3/uL (ref 3.8–10.6)

## 2017-12-05 LAB — GLUCOSE, CAPILLARY
GLUCOSE-CAPILLARY: 213 mg/dL — AB (ref 70–99)
Glucose-Capillary: 103 mg/dL — ABNORMAL HIGH (ref 70–99)
Glucose-Capillary: 267 mg/dL — ABNORMAL HIGH (ref 70–99)
Glucose-Capillary: 209 mg/dL — ABNORMAL HIGH (ref 70–99)

## 2017-12-05 LAB — MAGNESIUM: Magnesium: 1.8 mg/dL (ref 1.7–2.4)

## 2017-12-05 LAB — HIV ANTIBODY (ROUTINE TESTING W REFLEX): HIV Screen 4th Generation wRfx: NONREACTIVE

## 2017-12-05 SURGERY — FIXATION, FRACTURE, INTERTROCHANTERIC, WITH INTRAMEDULLARY ROD
Anesthesia: General | Site: Urethra | Laterality: Right

## 2017-12-05 MED ORDER — MENTHOL 3 MG MT LOZG
1.0000 | LOZENGE | OROMUCOSAL | Status: DC | PRN
  Filled 2017-12-05: qty 9

## 2017-12-05 MED ORDER — FENTANYL CITRATE (PF) 100 MCG/2ML IJ SOLN
INTRAMUSCULAR | Status: AC
  Filled 2017-12-05: qty 2

## 2017-12-05 MED ORDER — FENTANYL CITRATE (PF) 100 MCG/2ML IJ SOLN: 25.0000 ug | INTRAMUSCULAR | Status: DC | PRN

## 2017-12-05 MED ORDER — LIDOCAINE HCL (CARDIAC) PF 100 MG/5ML IV SOSY
PREFILLED_SYRINGE | INTRAVENOUS | Status: DC | PRN
  Administered 2017-12-05: 50 mg via INTRAVENOUS

## 2017-12-05 MED ORDER — ROCURONIUM BROMIDE 100 MG/10ML IV SOLN
INTRAVENOUS | Status: DC | PRN
  Administered 2017-12-05: 5 mg via INTRAVENOUS
  Administered 2017-12-05: 30 mg via INTRAVENOUS

## 2017-12-05 MED ORDER — ROCURONIUM BROMIDE 50 MG/5ML IV SOLN
INTRAVENOUS | Status: AC
  Filled 2017-12-05: qty 1

## 2017-12-05 MED ORDER — SUGAMMADEX SODIUM 200 MG/2ML IV SOLN
INTRAVENOUS | Status: DC | PRN
  Administered 2017-12-05: 200 mg via INTRAVENOUS

## 2017-12-05 MED ORDER — FENTANYL CITRATE (PF) 100 MCG/2ML IJ SOLN
INTRAMUSCULAR | Status: DC | PRN
  Administered 2017-12-05: 20 ug via INTRAVENOUS
  Administered 2017-12-05: 25 ug via INTRAVENOUS
  Administered 2017-12-05 (×2): 15 ug via INTRAVENOUS

## 2017-12-05 MED ORDER — KETOROLAC TROMETHAMINE 15 MG/ML IJ SOLN
7.5000 mg | Freq: Four times a day (QID) | INTRAMUSCULAR | Status: AC
  Administered 2017-12-05 – 2017-12-06 (×3): 7.5 mg via INTRAVENOUS
  Filled 2017-12-05 (×3): qty 1

## 2017-12-05 MED ORDER — POTASSIUM CHLORIDE CRYS ER 20 MEQ PO TBCR
40.0000 meq | EXTENDED_RELEASE_TABLET | Freq: Once | ORAL | Status: AC
Start: 2017-12-05 — End: 2017-12-05
  Administered 2017-12-05: 40 meq via ORAL
  Filled 2017-12-05: qty 2

## 2017-12-05 MED ORDER — PROPOFOL 10 MG/ML IV BOLUS
INTRAVENOUS | Status: AC
  Filled 2017-12-05: qty 20

## 2017-12-05 MED ORDER — METHOCARBAMOL 1000 MG/10ML IJ SOLN
500.0000 mg | Freq: Four times a day (QID) | INTRAVENOUS | Status: DC | PRN
  Filled 2017-12-05: qty 5

## 2017-12-05 MED ORDER — ACETAMINOPHEN 10 MG/ML IV SOLN
INTRAVENOUS | Status: AC
  Filled 2017-12-05: qty 100

## 2017-12-05 MED ORDER — ACETAMINOPHEN 10 MG/ML IV SOLN
INTRAVENOUS | Status: DC | PRN
  Administered 2017-12-05: 1000 mg via INTRAVENOUS

## 2017-12-05 MED ORDER — ONDANSETRON HCL 4 MG/2ML IJ SOLN: 4.0000 mg | Freq: Four times a day (QID) | INTRAMUSCULAR | Status: DC | PRN

## 2017-12-05 MED ORDER — CEFAZOLIN SODIUM-DEXTROSE 2-4 GM/100ML-% IV SOLN
2.0000 g | Freq: Four times a day (QID) | INTRAVENOUS | Status: AC
  Administered 2017-12-05 (×2): 2 g via INTRAVENOUS
  Filled 2017-12-05 (×2): qty 100

## 2017-12-05 MED ORDER — DOCUSATE SODIUM 100 MG PO CAPS
100.0000 mg | ORAL_CAPSULE | Freq: Two times a day (BID) | ORAL | Status: DC
  Administered 2017-12-05 – 2017-12-08 (×4): 100 mg via ORAL
  Filled 2017-12-05 (×4): qty 1

## 2017-12-05 MED ORDER — ONDANSETRON HCL 4 MG PO TABS
4.0000 mg | ORAL_TABLET | Freq: Four times a day (QID) | ORAL | Status: DC | PRN
Start: 2017-12-05 — End: 2017-12-08

## 2017-12-05 MED ORDER — OXYCODONE HCL 5 MG PO TABS: 5.0000 mg | ORAL_TABLET | Freq: Once | ORAL | Status: DC | PRN

## 2017-12-05 MED ORDER — BISACODYL 10 MG RE SUPP: 10.0000 mg | Freq: Every day | RECTAL | Status: DC | PRN

## 2017-12-05 MED ORDER — DEXAMETHASONE SODIUM PHOSPHATE 10 MG/ML IJ SOLN
INTRAMUSCULAR | Status: DC | PRN
  Administered 2017-12-05: 10 mg via INTRAVENOUS

## 2017-12-05 MED ORDER — ENOXAPARIN SODIUM 30 MG/0.3ML ~~LOC~~ SOLN
30.0000 mg | SUBCUTANEOUS | Status: DC
  Administered 2017-12-06 – 2017-12-08 (×3): 30 mg via SUBCUTANEOUS
  Filled 2017-12-05 (×3): qty 0.3

## 2017-12-05 MED ORDER — SODIUM CHLORIDE 0.9 % IR SOLN
Status: DC | PRN
  Administered 2017-12-05: 500 mL

## 2017-12-05 MED ORDER — LIDOCAINE HCL (PF) 2 % IJ SOLN
INTRAMUSCULAR | Status: AC
  Filled 2017-12-05: qty 10

## 2017-12-05 MED ORDER — PHENYLEPHRINE HCL 10 MG/ML IJ SOLN
INTRAMUSCULAR | Status: DC | PRN
  Administered 2017-12-05 (×5): 100 ug via INTRAVENOUS

## 2017-12-05 MED ORDER — OXYCODONE HCL 5 MG/5ML PO SOLN: 5.0000 mg | Freq: Once | ORAL | Status: DC | PRN

## 2017-12-05 MED ORDER — LACTATED RINGERS IV SOLN
INTRAVENOUS | Status: DC | PRN
  Administered 2017-12-05 (×2): via INTRAVENOUS

## 2017-12-05 MED ORDER — PROPOFOL 10 MG/ML IV BOLUS
INTRAVENOUS | Status: DC | PRN
  Administered 2017-12-05: 60 mg via INTRAVENOUS

## 2017-12-05 MED ORDER — METHOCARBAMOL 500 MG PO TABS: 500.0000 mg | ORAL_TABLET | Freq: Four times a day (QID) | ORAL | Status: DC | PRN

## 2017-12-05 MED ORDER — ACETAMINOPHEN 500 MG PO TABS
500.0000 mg | ORAL_TABLET | Freq: Four times a day (QID) | ORAL | Status: AC
  Administered 2017-12-05 – 2017-12-06 (×3): 500 mg via ORAL
  Filled 2017-12-05 (×3): qty 1

## 2017-12-05 MED ORDER — POTASSIUM CHLORIDE CRYS ER 20 MEQ PO TBCR
20.0000 meq | EXTENDED_RELEASE_TABLET | Freq: Every day | ORAL | Status: AC
  Administered 2017-12-05: 20 meq via ORAL
  Filled 2017-12-05: qty 1

## 2017-12-05 MED ORDER — PHENOL 1.4 % MT LIQD
1.0000 | OROMUCOSAL | Status: DC | PRN
  Filled 2017-12-05: qty 177

## 2017-12-05 MED ORDER — MAGNESIUM CITRATE PO SOLN
1.0000 | Freq: Once | ORAL | Status: DC | PRN
  Filled 2017-12-05: qty 296

## 2017-12-05 MED ORDER — SUGAMMADEX SODIUM 200 MG/2ML IV SOLN
INTRAVENOUS | Status: AC
  Filled 2017-12-05: qty 2

## 2017-12-05 MED ORDER — POTASSIUM CHLORIDE 10 MEQ/100ML IV SOLN
10.0000 meq | INTRAVENOUS | Status: AC
  Administered 2017-12-05 (×2): 10 meq via INTRAVENOUS
  Filled 2017-12-05 (×2): qty 100

## 2017-12-05 MED ORDER — MIDAZOLAM HCL 2 MG/2ML IJ SOLN
INTRAMUSCULAR | Status: AC
  Filled 2017-12-05: qty 2

## 2017-12-05 MED ORDER — MIDAZOLAM HCL 2 MG/2ML IJ SOLN
INTRAMUSCULAR | Status: DC | PRN
  Administered 2017-12-05: 1 mg via INTRAVENOUS

## 2017-12-05 MED ORDER — ONDANSETRON HCL 4 MG/2ML IJ SOLN
INTRAMUSCULAR | Status: DC | PRN
  Administered 2017-12-05: 4 mg via INTRAVENOUS

## 2017-12-05 MED ORDER — ALUM & MAG HYDROXIDE-SIMETH 200-200-20 MG/5ML PO SUSP: 30.0000 mL | ORAL | Status: DC | PRN

## 2017-12-05 SURGICAL SUPPLY — 41 items
BAG URINE DRAINAGE (UROLOGICAL SUPPLIES) ×3 IMPLANT
BIT DRILL CANN LG 4.3MM (BIT) ×2 IMPLANT
BNDG COHESIVE 6X5 TAN STRL LF (GAUZE/BANDAGES/DRESSINGS) ×6 IMPLANT
CANISTER SUCT 1200ML W/VALVE (MISCELLANEOUS) ×3 IMPLANT
CATH FOLEY 2W COUNCIL 5CC 16FR (CATHETERS) ×3 IMPLANT
DRAPE SHEET LG 3/4 BI-LAMINATE (DRAPES) ×6 IMPLANT
DRAPE SURG 17X11 SM STRL (DRAPES) ×6 IMPLANT
DRAPE U-SHAPE 47X51 STRL (DRAPES) ×3 IMPLANT
DRILL BIT CANN LG 4.3MM (BIT) ×3
DRSG OPSITE POSTOP 4X14 (GAUZE/BANDAGES/DRESSINGS) ×3 IMPLANT
DURAPREP 26ML APPLICATOR (WOUND CARE) ×6 IMPLANT
ELECT REM PT RETURN 9FT ADLT (ELECTROSURGICAL) ×3
ELECTRODE REM PT RTRN 9FT ADLT (ELECTROSURGICAL) ×2 IMPLANT
GLOVE BIOGEL PI IND STRL 9 (GLOVE) ×2 IMPLANT
GLOVE BIOGEL PI INDICATOR 9 (GLOVE) ×1
GLOVE SURG 9.0 ORTHO LTXF (GLOVE) ×6 IMPLANT
GOWN STRL REUS TWL 2XL XL LVL4 (GOWN DISPOSABLE) ×3 IMPLANT
GOWN STRL REUS W/ TWL LRG LVL3 (GOWN DISPOSABLE) ×2 IMPLANT
GOWN STRL REUS W/TWL LRG LVL3 (GOWN DISPOSABLE) ×1
GUIDEPIN VERSANAIL DSP 3.2X444 (ORTHOPEDIC DISPOSABLE SUPPLIES) ×3 IMPLANT
HEMOVAC 400CC 10FR (MISCELLANEOUS) ×3 IMPLANT
HIP FRA NAIL LAG SCREW 10.5X90 (Orthopedic Implant) ×3 IMPLANT
KIT TURNOVER CYSTO (KITS) ×3 IMPLANT
MAT BLUE FLOOR 46X72 FLO (MISCELLANEOUS) ×3 IMPLANT
NAIL HIP FRACT 130D 9X180 (Orthopedic Implant) ×3 IMPLANT
NS IRRIG 1000ML POUR BTL (IV SOLUTION) ×3 IMPLANT
PACK CYSTO AR (MISCELLANEOUS) ×3 IMPLANT
PACK HIP COMPR (MISCELLANEOUS) ×3 IMPLANT
SCREW BONE CORTICAL 5.0X32 (Screw) ×3 IMPLANT
SCREW LAG HIP FRA NAIL 10.5X90 (Orthopedic Implant) ×2 IMPLANT
SENSORWIRE 0.038 NOT ANGLED (WIRE) ×3
SET CYSTO W/LG BORE CLAMP LF (SET/KITS/TRAYS/PACK) ×3 IMPLANT
STAPLER SKIN PROX 35W (STAPLE) ×3 IMPLANT
SUCTION FRAZIER HANDLE 10FR (MISCELLANEOUS) ×1
SUCTION TUBE FRAZIER 10FR DISP (MISCELLANEOUS) ×2 IMPLANT
SUT VIC AB 0 CT1 36 (SUTURE) ×6 IMPLANT
SUT VIC AB 2-0 CT1 27 (SUTURE) ×1
SUT VIC AB 2-0 CT1 TAPERPNT 27 (SUTURE) ×2 IMPLANT
SUT VICRYL 0 AB UR-6 (SUTURE) ×3 IMPLANT
SYR 30ML LL (SYRINGE) ×3 IMPLANT
WIRE SENSOR 0.038 NOT ANGLED (WIRE) ×2 IMPLANT

## 2017-12-05 NOTE — Anesthesia Postprocedure Evaluation (Signed)
Anesthesia Post Note  Patient: [name]  Procedure(s) Performed: INTRAMEDULLARY (IM) NAIL INTERTROCHANTRIC (Right )  Patient location during evaluation: PACU Anesthesia Type: General Level of consciousness: oriented and awake and alert Pain management: pain level controlled Vital Signs Assessment: post-procedure vital signs reviewed and stable Respiratory status: spontaneous breathing, respiratory function stable and patient connected to nasal cannula oxygen Cardiovascular status: blood pressure returned to baseline and stable Postop Assessment: no headache, no backache and no apparent nausea or vomiting Anesthetic complications: no     Last Vitals:  Vitals:   12/05/17 1222 12/05/17 1242  BP: 116/71 115/77  Pulse: 78 76  Resp: 18 18  Temp:  36.4 C  SpO2: 100% 99%    Last Pain:  Vitals:   12/05/17 1242  TempSrc: Oral  PainSc:                  Cleda MccreedyJoseph K Adryana Mogensen

## 2017-12-05 NOTE — Op Note (Signed)
Operative Note  Preoperative diagnosis:  1.  Difficult Foley catheter placement  Postoperative diagnosis: 1.  Difficult Foley catheter placement  Procedure(s): 1.  Cystoscopy with complex catheter placement over a wire  Surgeon: Modena SlaterEugene Montavious Wierzba, MD  Assistants: None  Anesthesia: General  Complications: None immediate  EBL: None for my portion  Specimens: 1.  None  Drains/Catheters: 1.  16 French council tip catheter  Intraoperative findings: 1.  Normal anterior urethra 2.  Nonobstructing short small prostate 3.  Normal bladder mucosa without any stones or masses.  Indication: 52 year old male who just completed a hip fracture repair.  Nursing was unable to place a catheter on the floor as well as in the operating room and therefore I was consulted.  Description of procedure:  Upon entering the room, the patient was in the supine position.  He was prepped and draped in standard sterile fashion.  A 17 French flexible cystoscope was advanced into the urethra and passed easily into the bladder.  Cystoscopy was performed.  The findings are noted above.  A wire was advanced through the scope and the scope was withdrawn.  A 16 French council tip Foley catheter was advanced over the wire and into the bladder.  10 cc of sterile water was placed into the catheter balloon and the wire was removed.  A bag was attached to the catheter with return of clear urine.  This concluded my portion of involvement.  The patient tolerated the procedure well and was stable upon completion.  Plan: Foley catheter can be removed at the discretion of orthopedic surgery.  I would however wait until at least early Monday morning.  Please only remove the catheter early in the morning so that if there is a problem, urology will be in-house and easily available.

## 2017-12-05 NOTE — Plan of Care (Signed)
  Problem: Education: Goal: Knowledge of General Education information will improve Outcome: Progressing   Problem: Health Behavior/Discharge Planning: Goal: Ability to manage health-related needs will improve Outcome: Progressing   Problem: Clinical Measurements: Goal: Ability to maintain clinical measurements within normal limits will improve Outcome: Progressing Goal: Will remain free from infection Outcome: Progressing Goal: Diagnostic test results will improve Outcome: Progressing Goal: Respiratory complications will improve Outcome: Progressing Goal: Cardiovascular complication will be avoided Outcome: Progressing   Problem: Activity: Goal: Risk for activity intolerance will decrease Outcome: Progressing   Problem: Nutrition: Goal: Adequate nutrition will be maintained Outcome: Progressing   Problem: Coping: Goal: Level of anxiety will decrease Outcome: Progressing   

## 2017-12-05 NOTE — Progress Notes (Signed)
Subjective:  Patient reports right hip pain as mild.  No acute issues.    Objective:   VITALS:   Vitals:   12/04/17 1335 12/04/17 2300 12/05/17 0500 12/05/17 0754  BP:  120/74  116/75  Pulse:  (!) 103  (!) 101  Resp:  20    Temp:  98.8 F (37.1 C)  98.4 F (36.9 C)  TempSrc:  Oral  Oral  SpO2:  100%  100%  Weight: 40.8 kg (90 lb)  41.2 kg (90 lb 13.3 oz)     PHYSICAL EXAM: Right lower extremity: Neurovascular intact Sensation intact distally Intact pulses distally Dorsiflexion/Plantar flexion intact No cellulitis present Compartment soft  LABS  Results for orders placed or performed during the hospital encounter of 12/04/17 (from the past 24 hour(s))  CBC with Differential     Status: Abnormal   Collection Time: 12/04/17 10:26 AM  Result Value Ref Range   WBC 4.4 3.8 - 10.6 K/uL   RBC 3.42 (L) 4.40 - 5.90 MIL/uL   Hemoglobin 12.1 (L) 13.0 - 18.0 g/dL   HCT 82.9 (L) 56.2 - 13.0 %   MCV 99.9 80.0 - 100.0 fL   MCH 35.4 (H) 26.0 - 34.0 pg   MCHC 35.4 32.0 - 36.0 g/dL   RDW 86.5 78.4 - 69.6 %   Platelets 210 150 - 440 K/uL   Neutrophils Relative % 87 %   Neutro Abs 3.8 1.4 - 6.5 K/uL   Lymphocytes Relative 4 %   Lymphs Abs 0.2 (L) 1.0 - 3.6 K/uL   Monocytes Relative 9 %   Monocytes Absolute 0.4 0.2 - 1.0 K/uL   Eosinophils Relative 0 %   Eosinophils Absolute 0.0 0 - 0.7 K/uL   Basophils Relative 0 %   Basophils Absolute 0.0 0 - 0.1 K/uL  Basic metabolic panel     Status: Abnormal   Collection Time: 12/04/17 10:26 AM  Result Value Ref Range   Sodium 129 (L) 135 - 145 mmol/L   Potassium 2.5 (LL) 3.5 - 5.1 mmol/L   Chloride 97 (L) 98 - 111 mmol/L   CO2 19 (L) 22 - 32 mmol/L   Glucose, Bld 300 (H) 70 - 99 mg/dL   BUN 8 6 - 20 mg/dL   Creatinine, Ser 2.95 0.61 - 1.24 mg/dL   Calcium 8.1 (L) 8.9 - 10.3 mg/dL   GFR calc non Af Amer >60 >60 mL/min   GFR calc Af Amer >60 >60 mL/min   Anion gap 13 5 - 15  Type and screen [facility]      Status: None   Collection Time: 12/04/17 10:26 AM  Result Value Ref Range   ABO/RH(D) O POS    Antibody Screen NEG    Sample Expiration      12/07/2017 Performed at [facility], [address] [place], [place] [address]   Glucose, capillary     Status: Abnormal   Collection Time: 12/04/17 12:50 PM  Result Value Ref Range   Glucose-Capillary 179 (H) 70 - 99 mg/dL  Hemoglobin K4M     Status: Abnormal   Collection Time: 12/04/17  1:29 PM  Result Value Ref Range   Hgb A1c MFr Bld 7.8 (H) 4.8 - 5.6 %   Mean Plasma Glucose 177.16 mg/dL  HIV antibody     Status: None   Collection Time: 12/04/17  1:29 PM  Result Value Ref Range   HIV Screen 4th Generation wRfx Non Reactive Non Reactive  Glucose, capillary  Status: Abnormal   Collection Time: 12/04/17  1:57 PM  Result Value Ref Range   Glucose-Capillary 233 (H) 70 - 99 mg/dL   Comment 1 Notify RN   MRSA PCR Screening     Status: Abnormal   Collection Time: 12/04/17  2:01 PM  Result Value Ref Range   MRSA by PCR POSITIVE (A) NEGATIVE  Glucose, capillary     Status: Abnormal   Collection Time: 12/04/17  5:21 PM  Result Value Ref Range   Glucose-Capillary 262 (H) 70 - 99 mg/dL  Potassium     Status: Abnormal   Collection Time: 12/04/17  6:45 PM  Result Value Ref Range   Potassium 2.9 (L) 3.5 - 5.1 mmol/L  APTT     Status: None   Collection Time: 12/04/17  7:44 PM  Result Value Ref Range   aPTT 29 24 - 36 seconds  Protime-INR     Status: None   Collection Time: 12/04/17  7:44 PM  Result Value Ref Range   Prothrombin Time 13.8 11.4 - 15.2 seconds   INR 1.07   Glucose, capillary     Status: Abnormal   Collection Time: 12/04/17  9:08 PM  Result Value Ref Range   Glucose-Capillary 203 (H) 70 - 99 mg/dL  Basic metabolic panel     Status: Abnormal   Collection Time: 12/05/17  5:39 AM  Result Value Ref Range   Sodium 131 (L) 135 - 145 mmol/L   Potassium 3.2 (L) 3.5 - 5.1 mmol/L   Chloride 103 98 - 111 mmol/L   CO2  20 (L) 22 - 32 mmol/L   Glucose, Bld 190 (H) 70 - 99 mg/dL   BUN 11 6 - 20 mg/dL   Creatinine, Ser 4.090.57 (L) 0.61 - 1.24 mg/dL   Calcium 8.2 (L) 8.9 - 10.3 mg/dL   GFR calc non Af Amer >60 >60 mL/min   GFR calc Af Amer >60 >60 mL/min   Anion gap 8 5 - 15  CBC     Status: Abnormal   Collection Time: 12/05/17  5:39 AM  Result Value Ref Range   WBC 5.3 3.8 - 10.6 K/uL   RBC 3.42 (L) 4.40 - 5.90 MIL/uL   Hemoglobin 12.3 (L) 13.0 - 18.0 g/dL   HCT 81.134.3 (L) 91.440.0 - 78.252.0 %   MCV 100.1 (H) 80.0 - 100.0 fL   MCH 35.8 (H) 26.0 - 34.0 pg   MCHC 35.8 32.0 - 36.0 g/dL   RDW 95.613.0 21.311.5 - 08.614.5 %   Platelets 224 150 - 440 K/uL  Glucose, capillary     Status: Abnormal   Collection Time: 12/05/17  7:56 AM  Result Value Ref Range   Glucose-Capillary 213 (H) 70 - 99 mg/dL    Dg Hip Unilat W Or Wo Pelvis 2-3 Views Right  Result Date: 12/04/2017 CLINICAL DATA:  Acute RIGHT hip pain following fall today. Initial encounter. EXAM: DG HIP (WITH OR WITHOUT PELVIS) 2-3V RIGHT COMPARISON:  None. FINDINGS: An intertrochanteric RIGHT femur fracture is noted with valgus angulation. No definite displacement on these views. No other fracture, subluxation or dislocation noted. No suspicious focal bony lesions are present. IMPRESSION: Intertrochanteric RIGHT femur fracture with valgus angulation. Electronically Signed   By: Harmon PierJeffrey  Hu M.D.   On: 12/04/2017 09:59    Assessment/Plan: Day of Surgery   Active Problems:   Closed right hip fracture (HCC)   Pressure injury of skin   Spoke with Ernestina PatchesAdrian Daye director of [place] [facility] and obtained  consent by phone.  Marchelle Folks, patient's RN witnessed the phone conversation/consent.  I explained to Ms. Daye the details of the operation, the post-op course and risks and benefits of surgery.  The surgical risks include but are not limited to infection, bleeding, nerve or blood vessel injury, leg length discrepancy, change in leg rotation, persistent pain and need for further surgery.   She understood and agreed with the plan for surgery.    Juanell Fairly , MD 12/05/2017, 9:14 AM

## 2017-12-05 NOTE — Progress Notes (Signed)
Attempted to call for consent for surgery to legal guardian, still no answer, cell voicemail box is full.

## 2017-12-05 NOTE — Anesthesia Post-op Follow-up Note (Signed)
Anesthesia QCDR form completed.        

## 2017-12-05 NOTE — Anesthesia Procedure Notes (Addendum)
Procedure Name: Intubation Date/Time: 12/05/2017 9:50 AM Performed by: Estanislado EmmsShort, Mozella Rexrode L, CRNA Pre-anesthesia Checklist: Patient identified, Patient being monitored, Timeout performed, Emergency Drugs available and Suction available Patient Re-evaluated:Patient Re-evaluated prior to induction Oxygen Delivery Method: Circle system utilized Preoxygenation: Pre-oxygenation with 100% oxygen Induction Type: IV induction Ventilation: Mask ventilation without difficulty Laryngoscope Size: Miller and 2 Grade View: Grade II Tube type: Oral Tube size: 7.0 mm Number of attempts: 1 Airway Equipment and Method: Stylet Placement Confirmation: ETT inserted through vocal cords under direct vision,  positive ETCO2 and breath sounds checked- equal and bilateral Secured at: 23 cm Tube secured with: Tape Dental Injury: Teeth and Oropharynx as per pre-operative assessment

## 2017-12-05 NOTE — Progress Notes (Signed)
[facility] [place] [facility]   PATIENT NAME: Reginald Hall    MR#:  [id number]  DATE OF BIRTH:  03/21/1966  SUBJECTIVE:  CHIEF COMPLAINT:  Pt was seen after surgery, sore and hungry Brother at bed side.  OR RN noticed watery diarrhea during surgery  REVIEW OF SYSTEMS:  CONSTITUTIONAL: No fever, fatigue or weakness.  EYES: No blurred or double vision.  EARS, NOSE, AND THROAT: No tinnitus or ear pain.  RESPIRATORY: No cough, shortness of breath, wheezing or hemoptysis.  CARDIOVASCULAR: No chest pain, orthopnea, edema.  GASTROINTESTINAL: No nausea, vomiting, diarrhea or abdominal pain.  GENITOURINARY: No dysuria, hematuria.  ENDOCRINE: No polyuria, nocturia,  HEMATOLOGY: No anemia, easy bruising or bleeding SKIN: No rash or lesion. MUSCULOSKELETAL: Right hip  pain NEUROLOGIC: No tingling, numbness, weakness.  PSYCHIATRY: No anxiety or depression.   DRUG ALLERGIES:   Allergies  Allergen Reactions  . Other     GLUTEN INTOLERANCE    VITALS:  Blood pressure 110/63, pulse 81, temperature 99 F (37.2 C), temperature source Oral, resp. rate 18, height 4' 9.99" (1.473 m), weight 41.2 kg (90 lb 13.3 oz), SpO2 100 %.  PHYSICAL EXAMINATION:  GENERAL:  52 y.o.-year-old patient lying in the bed with no acute distress.  EYES: Pupils equal, round, reactive to light and accommodation. No scleral icterus. Extraocular muscles intact.  HEENT: Head atraumatic, normocephalic. Oropharynx and nasopharynx clear.  NECK:  Supple, no jugular venous distention. No thyroid enlargement, no tenderness.  LUNGS: Normal breath sounds bilaterally, no wheezing, rales,rhonchi or crepitation. No use of accessory muscles of respiration.  CARDIOVASCULAR: S1, S2 normal. No murmurs, rubs, or gallops.  ABDOMEN: Soft, nontender, nondistended. Bowel sounds present. EXTREMITIES: Right hip with honeycomb dressing and tender  nEUROLOGIC: Awake, alert and oriented x3 sensation intact. Gait  not checked.  Mentally challenged PSYCHIATRIC: The patient is alert and oriented x 3.  SKIN: No obvious rash, lesion, or ulcer.    LABORATORY PANEL:   CBC Recent Labs  Lab 12/05/17 0539  WBC 5.3  HGB 12.3*  HCT 34.3*  PLT 224   ------------------------------------------------------------------------------------------------------------------  Chemistries  Recent Labs  Lab 12/05/17 0539  NA 131*  K 3.2*  CL 103  CO2 20*  GLUCOSE 190*  BUN 11  CREATININE 0.57*  CALCIUM 8.2*   ------------------------------------------------------------------------------------------------------------------  Cardiac Enzymes No results for input(s): TROPONINI in the last 168 hours. ------------------------------------------------------------------------------------------------------------------  RADIOLOGY:  Dg Hip Port Unilat With Pelvis 1v Right  Result Date: 12/05/2017 CLINICAL DATA:  Status post right hip fracture fixation. EXAM: DG HIP (WITH OR WITHOUT PELVIS) 1V PORT RIGHT COMPARISON:  12/04/2017 FINDINGS: There has been interval repair of intertrochanteric right femoral fracture with intramedullary nail and screw placement. The alignment is anatomic. Expected postsurgical changes noted. IMPRESSION: Status post open reduction internal fixation of right intertrochanteric fracture without evidence of immediate complications. Electronically Signed   By: Ted Mcalpineobrinka  Dimitrova M.D.   On: 12/05/2017 12:44   Dg Hip Operative Unilat W Or W/o Pelvis Right  Result Date: 12/05/2017 CLINICAL DATA:  Intraoperative images from open reduction internal fixation of right intertrochanteric femoral fracture. EXAM: OPERATIVE RIGHT HIP (WITH PELVIS IF PERFORMED) TECHNIQUE: Fluoroscopic spot image(s) were submitted for interpretation post-operatively. COMPARISON:  12/04/2017 FINDINGS: Multiple fluoroscopic intraoperative images from intramedullary pin and screw fixation of right intertrochanteric femoral fracture  demonstrate normal alignment of the orthopedic hardware. No new fractures are seen. Fluoroscopy time is recorded as 52 seconds. IMPRESSION: Fluoroscopic images from open reduction internal fixation of right  intertrochanteric femoral fracture without evidence of immediate complications. Electronically Signed   By: Ted Mcalpine M.D.   On: 12/05/2017 13:05   Dg Hip Unilat W Or Wo Pelvis 2-3 Views Right  Result Date: 12/04/2017 CLINICAL DATA:  Acute RIGHT hip pain following fall today. Initial encounter. EXAM: DG HIP (WITH OR WITHOUT PELVIS) 2-3V RIGHT COMPARISON:  None. FINDINGS: An intertrochanteric RIGHT femur fracture is noted with valgus angulation. No definite displacement on these views. No other fracture, subluxation or dislocation noted. No suspicious focal bony lesions are present. IMPRESSION: Intertrochanteric RIGHT femur fracture with valgus angulation. Electronically Signed   By: Harmon Pier M.D.   On: 12/04/2017 09:59    EKG:   Orders placed or performed during the hospital encounter of 12/04/17  . ED EKG  . ED EKG  . EKG 12-Lead  . EKG 12-Lead    ASSESSMENT AND PLAN:    *Intertrochanteric right femur fracture postop day #0 Postop care by Dr. Martha Clan Pain medications added. Check a.m. labs  *Watery diarrhea check stool for C. difficile toxin patient is on enteric precautions *Severe hypokalemia.  Will replace aggressively through IV and oral.  Telemetry monitoring till potassium corrects.  Repeat potassium level in the evening.  *Diabetes mellitus.  Continue home medications.  Sliding scale insulin.  *Chronic mild hyponatremia.  Monitor, Sodium 131  *Hypertension.  Continue medications.  Hydralazine IV as needed added      All the records are reviewed and case discussed with Care Management/Social Workerr. Management plans discussed with the patient, brother at bed side and they are in agreement.  CODE STATUS: prior  TOTAL TIME TAKING CARE OF THIS  PATIENT: 35 minutes.   POSSIBLE D/C IN 2-3 DAYS, DEPENDING ON CLINICAL CONDITION.  Note: This dictation was prepared with Dragon dictation along with smaller phrase technology. Any transcriptional errors that result from this process are unintentional.   Ramonita Lab M.D on 12/05/2017 at 2:09 PM  Between 7am to 6pm - Pager - [phone] After 6pm go to [email or web address] - password [email or web address] [facility]  [facility]  Office  [phone]  CC: Primary care physician; Derwood Kaplan, MD

## 2017-12-05 NOTE — Op Note (Signed)
DATE OF SURGERY:  12/05/2017  TIME: 11:51 AM  PATIENT NAME:  Reginald Hall  AGE: 52 y.o.  PRE-OPERATIVE DIAGNOSIS:  RIGHT INTERTROCHANTERIC HIP FRACTURE  POST-OPERATIVE DIAGNOSIS:  SAME  PROCEDURE:  INTRAMEDULLARY (IM) NAIL FOR RIGHT INTERTROCHANTRIC HIP FRACTURE  SURGEON:  Juanell Fairly  OPERATIVE IMPLANTS: Biomet short Affixus nail 9 x , 90 mm lag screw with a 32 mm distal interlocking screw  PREOPERATIVE INDICATIONS:  Reginald Hall is a 52 y.o. year old who fell and suffered a hip fracture. He was brought into the ER and then admitted and medically cleared for surgical intervention.    The risks, benefits and alternatives were discussed with the patient and their family.  The risks include but are not limited to infection, bleeding, nerve or blood vessel injury, malunion, nonunion, hardware prominence, hardware failure, change in leg lengths or lower extremity rotation need for further surgery including hardware removal with conversion to a total hip arthroplasty. Medical risks include but are not limited to DVT and pulmonary embolism, myocardial infarction, stroke, pneumonia, respiratory failure and death. The patient and their family understood these risks and wished to proceed with surgery.  OPERATIVE PROCEDURE:  The patient was brought to the operating room and placed in the supine position on the fracture table. General anesthesia was administered.  A closed reduction was performed under C-arm guidance.  The fracture reduction was confirmed on both AP and lateral views. A time out was performed to verify the patient's name, date of birth, medical record number, correct site of surgery correct procedure to be performed. The timeout was also used to verify the patient received antibiotics and all appropriate instruments, implants and radiographic studies were available in the room. Once all in attendance were in agreement, the case began. The patient was prepped and draped in a  sterile fashion. He received preoperative antibiotics with kefzol.  An incision was made proximal to the greater trochanter in line with the femur. A guidewire was placed over the tip of the greater trochanter and advanced into the proximal femur to the level of the lesser trochanter.  Confirmation of the drill pin position was made on AP and lateral C-arm images.  The threaded guidepin was then overdrilled with the proximal femoral drill.  The nail was then inserted into the proximal femur, across the fracture site and into the femoral shaft. Its position was confirmed on AP and lateral C-arm images.   Once the nail was completely seated, the drill guide for the lag screw was placed through the guide arm for the Affixus nail. A guidepin was then placed through this drill guide and advanced through the lateral cortex of the femur, across the fracture site and into the femoral head achieving a tip apex distance of less than 25 mm. The length of the drill pin was measured, and then the drill for the lag screw was advanced through the lateral cortex, across the fracture site and up into the femoral head to the depth of the lag screw.  The lag screw was measured to be 90 mm in length.  The lag screw was then advanced by hand into position across the fracture site into the femoral head. Its final position was confirmed on AP and lateral C-arm images. Compression was applied. The set screw in the top of the intramedullary rod was tightened by hand using a screwdriver. It was backed off a quarter turn to allow for compression at the fracture site.  The drill sleeve for  the distal interlocking screw was then placed through the Affixus guide arm. A small stab incision was made to allow the drill guide to approximate the lateral cortex of the femur. The drill for the distal interlocking screw was then advanced bicortically. The depth of this drill was measured. A distal interlocking screw with the length measured was  then inserted by hand through the guide arm. Final C-arm images of the entire intramedullary construct were taken in both the AP and lateral planes.   The wounds were irrigated copiously and closed with 0 Vicryl for closure of the deep fascia and 2-0 Vicryl for subcutaneous closure. The skin was approximated with staples. A dry sterile dressing was applied. I was scrubbed and present the entire case and all sharp and instrument counts were correct at the conclusion of the case.   Patient had a foley catheter placed by the on-call urologist, Dr. Alvester MorinBell due to difficulty placing catheter prior to surgery in the OR.  Patient was then transferred to hospital bed and brought to PACU in stable condition.     Reginald DevoidKevin L Kaoir Loree, MD

## 2017-12-05 NOTE — Plan of Care (Signed)
  Problem: Education: Goal: Knowledge of General Education information will improve Outcome: Progressing   Problem: Health Behavior/Discharge Planning: Goal: Ability to manage health-related needs will improve Outcome: Progressing   Problem: Clinical Measurements: Goal: Ability to maintain clinical measurements within normal limits will improve Outcome: Progressing Goal: Will remain free from infection Outcome: Progressing Goal: Diagnostic test results will improve Outcome: Progressing Goal: Respiratory complications will improve Outcome: Progressing Goal: Cardiovascular complication will be avoided Outcome: Progressing   Problem: Activity: Goal: Risk for activity intolerance will decrease Outcome: Progressing   Problem: Nutrition: Goal: Adequate nutrition will be maintained Outcome: Progressing   Problem: Coping: Goal: Level of anxiety will decrease Outcome: Progressing   Problem: Elimination: Goal: Will not experience complications related to bowel motility Outcome: Progressing Goal: Will not experience complications related to urinary retention Outcome: Progressing   Problem: Pain Managment: Goal: General experience of comfort will improve Outcome: Progressing   Problem: Safety: Goal: Ability to remain free from injury will improve Outcome: Progressing   Problem: Skin Integrity: Goal: Risk for impaired skin integrity will decrease Outcome: Progressing   Problem: Education: Goal: Verbalization of understanding the information provided (i.e., activity precautions, restrictions, etc) will improve Outcome: Progressing   Problem: Activity: Goal: Ability to ambulate and perform ADLs will improve Outcome: Progressing   Problem: Clinical Measurements: Goal: Postoperative complications will be avoided or minimized Outcome: Progressing   Problem: Self-Concept: Goal: Ability to maintain and perform role responsibilities to the fullest extent possible will  improve Outcome: Progressing   Problem: Pain Management: Goal: Pain level will decrease Outcome: Progressing   

## 2017-12-05 NOTE — Progress Notes (Signed)
Attempted to call [employer]ys [employer] Us[employer]bonie White (legal gaurdian) for surgical consent. No answer. Left voicemail.

## 2017-12-05 NOTE — Transfer of Care (Signed)
Immediate Anesthesia Transfer of Care Note  Patient: Reginald Hall  Procedure(s) Performed: INTRAMEDULLARY (IM) NAIL INTERTROCHANTRIC (Right )  Patient Location: PACU  Anesthesia Type:General  Level of Consciousness: awake, oriented and patient cooperative  Airway & Oxygen Therapy: Patient Spontanous Breathing and Patient connected to nasal cannula oxygen  Post-op Assessment: Report given to RN and Post -op Vital signs reviewed and stable  Post vital signs: Reviewed and stable  Last Vitals:  Vitals Value Taken Time  BP 104/64 12/05/2017 11:39 AM  Temp 37.2 C 12/05/2017 11:39 AM  Pulse 78 12/05/2017 11:39 AM  Resp 16 12/05/2017 11:40 AM  SpO2 100 % 12/05/2017 11:39 AM  Vitals shown include unvalidated device data.  Last Pain:  Vitals:   12/05/17 1139  TempSrc: Temporal  PainSc: 0-No pain      Patients Stated Pain Goal: 2 (12/04/17 2207)  Complications: No apparent anesthesia complications

## 2017-12-05 NOTE — Consult Note (Signed)
MEDICATION RELATED CONSULT NOTE - INITAL   Pharmacy Consult for electrolytes Indication: hypokalemia  Allergies  Allergen Reactions  . Other     GLUTEN INTOLERANCE    Patient Measurements: Height: 4' 9.99" (147.3 cm) Weight: 90 lb 13.3 oz (41.2 kg) IBW/kg (Calculated) : 45.38 Adjusted Body Weight:   Vital Signs: Temp: 99 F (37.2 C) (06/29 1348) Temp Source: Oral (06/29 1348) BP: 110/63 (06/29 1348) Pulse Rate: 81 (06/29 1348) Intake/Output from previous day: 06/28 0701 - 06/29 0700 In: 340 [P.O.:240; IV Piggyback:100] Out: 600 [Urine:600] Intake/Output from this shift: Total I/O In: 1500 [P.O.:50; I.V.:1100; IV Piggyback:350] Out: 25 [Blood:25]  Labs: Recent Labs    12/04/17 1026 12/04/17 1944 12/05/17 0539  WBC 4.4  --  5.3  HGB 12.1*  --  12.3*  HCT 34.2*  --  34.3*  PLT 210  --  224  APTT  --  29  --   CREATININE 0.73  --  0.57*  MG  --   --  1.8   Estimated Creatinine Clearance: 61.5 mL/min (A) (by C-G formula based on SCr of 0.57 mg/dL (L)).   Microbiology: Recent Results (from the past 720 hour(s))  MRSA PCR Screening     Status: Abnormal   Collection Time: 12/04/17  2:01 PM  Result Value Ref Range Status   MRSA by PCR POSITIVE (A) NEGATIVE Final    Comment:        The GeneXpert MRSA Assay (FDA approved for NASAL specimens only), is one component of a comprehensive MRSA colonization surveillance program. It is not intended to diagnose MRSA infection nor to guide or monitor treatment for MRSA infections. RESULT CALLED TO, READ BACK BY AND VERIFIED WITH: CORY HUDSON 12/04/17 AT 1538 BY HS Performed at [facility], [address] [place], [place] [address]     Medications:  Scheduled:  . acetaminophen  500 mg Oral Q6H  . Chlorhexidine Gluconate Cloth  6 each Topical Q0600  . docusate sodium  100 mg Oral BID  . [START ON 12/06/2017] enoxaparin (LOVENOX) injection  30 mg Subcutaneous Q24H  . glimepiride  2 mg Oral Q breakfast   . insulin aspart  0-15 Units Subcutaneous TID WC  . insulin aspart  0-5 Units Subcutaneous QHS  . ketorolac  7.5 mg Intravenous Q6H  . losartan  50 mg Oral [id number]  . mupirocin ointment  1 application Nasal BID  . pantoprazole  40 mg Oral BID  . potassium chloride  40 mEq Oral Once    Assessment: Pt is a [age] year old male admitted with hip fracture and hypokalemia. Pt having watery diarrhea. K on admission was 2.5. Pt received 140 MEQ of KCL yesterday and another 20 MEQ this AM. K up to 3.2. Add on Mg was 1.8.  Goal of Therapy:  K=3.5-5  Plan:  I will give another 40 MEQ now and 20 MEQ at bedime. Recheck in the AM  UG[employer] CorporationMelissa D Daviona Herbert, Pharm.D, BCPS Clinical Pharmacist

## 2017-12-05 NOTE — Anesthesia Preprocedure Evaluation (Signed)
Anesthesia Evaluation  Patient identified by MRN, date of birth, ID band Patient awake    Reviewed: Allergy & Precautions, H&P , NPO status , Patient's Chart, lab work & pertinent test results  History of Anesthesia Complications Negative for: history of anesthetic complications  Airway Mallampati: III  TM Distance: <3 FB Neck ROM: limited    Dental  (+) Chipped, Poor Dentition, Missing   Pulmonary neg pulmonary ROS, neg shortness of breath,           Cardiovascular Exercise Tolerance: Good hypertension, (-) angina(-) Past MI and (-) DOE      Neuro/Psych PSYCHIATRIC DISORDERS negative neurological ROS  negative psych ROS   GI/Hepatic negative GI ROS, Neg liver ROS,   Endo/Other  diabetes, Type 2  Renal/GU      Musculoskeletal   Abdominal   Peds  (+) mental retardation Hematology negative hematology ROS (+)   Anesthesia Other Findings Past Medical History: No date: Diabetes mellitus without complication (HCC)     Comment:  Patient takes Metformin No date: Hypertension No date: Hyponatremia No date: Mental retardation  Past Surgical History: 03/23/2015: ESOPHAGOGASTRODUODENOSCOPY (EGD) WITH PROPOFOL; N/A     Comment:  Procedure: ESOPHAGOGASTRODUODENOSCOPY (EGD) WITH               PROPOFOL;  Surgeon: Elnita MaxwellMatthew Gordon Rein, MD;  [facility];  Service: Endoscopy;  Laterality: N/A; 04/07/2016: INGUINAL HERNIA REPAIR; Right     Comment:  Procedure: HERNIA REPAIR INGUINAL ADULT;  Surgeon:               Kieth BrightlySeeplaputhur G Sankar, MD;  Location: [facility] ORS;  Service:              General;  Laterality: Right; No date: MRSA  BMI    Body Mass Index:  18.35 kg/m      Reproductive/Obstetrics negative OB ROS                             Anesthesia Physical Anesthesia Plan  ASA: III  Anesthesia Plan: General ETT   Post-op Pain Management:    Induction:  Intravenous  PONV Risk Score and Plan: Ondansetron, Dexamethasone, Midazolam and Treatment may vary due to age or medical condition  Airway Management Planned: Oral ETT  Additional Equipment:   Intra-op Plan:   Post-operative Plan: Extubation in OR  Informed Consent: I have reviewed the patients History and Physical, chart, labs and discussed the procedure including the risks, benefits and alternatives for the proposed anesthesia with the patient or authorized representative who has indicated his/her understanding and acceptance.   Dental Advisory Given  Plan Discussed with: Anesthesiologist, CRNA and Surgeon  Anesthesia Plan Comments: (Phone consent from Reginald Hall, patient is ward of the state Plan for [place] as patient has been having voiding problems and floor nursing staff was unable to place foley)        Anesthesia Quick Evaluation

## 2017-12-06 ENCOUNTER — Encounter: Payer: Self-pay | Admitting: Orthopedic Surgery

## 2017-12-06 LAB — GLUCOSE, CAPILLARY
GLUCOSE-CAPILLARY: 202 mg/dL — AB (ref 70–99)
Glucose-Capillary: 205 mg/dL — ABNORMAL HIGH (ref 70–99)
Glucose-Capillary: 245 mg/dL — ABNORMAL HIGH (ref 70–99)
Glucose-Capillary: 202 mg/dL — ABNORMAL HIGH (ref 70–99)

## 2017-12-06 LAB — CBC
HCT: 32.4 % — ABNORMAL LOW (ref 40.0–52.0)
Hemoglobin: 11.4 g/dL — ABNORMAL LOW (ref 13.0–18.0)
MCH: 34.9 pg — AB (ref 26.0–34.0)
MCHC: 35.3 g/dL (ref 32.0–36.0)
MCV: 98.9 fL (ref 80.0–100.0)
PLATELETS: 212 10*3/uL (ref 150–440)
RBC: 3.28 MIL/uL — ABNORMAL LOW (ref 4.40–5.90)
RDW: 13.5 % (ref 11.5–14.5)
WBC: 7.5 10*3/uL (ref 3.8–10.6)

## 2017-12-06 LAB — BASIC METABOLIC PANEL
Anion gap: 8 (ref 5–15)
BUN: 12 mg/dL (ref 6–20)
CO2: 20 mmol/L — ABNORMAL LOW (ref 22–32)
Calcium: 7.9 mg/dL — ABNORMAL LOW (ref 8.9–10.3)
Chloride: 104 mmol/L (ref 98–111)
Creatinine, Ser: 0.48 mg/dL — ABNORMAL LOW (ref 0.61–1.24)
GFR calc Af Amer: >60 mL/min (ref >60)
GFR calc non Af Amer: >60 mL/min (ref >60)
Glucose, Bld: 203 mg/dL — ABNORMAL HIGH (ref 70–99)
Potassium: 4.6 mmol/L (ref 3.5–5.1)
SODIUM: 132 mmol/L — AB (ref 135–145)

## 2017-12-06 LAB — C DIFFICILE QUICK SCREEN W PCR REFLEX
C DIFFICILE (CDIFF) INTERP: NOT DETECTED
C DIFFICILE (CDIFF) TOXIN: NEGATIVE
C DIFFICLE (CDIFF) ANTIGEN: NEGATIVE

## 2017-12-06 MED ORDER — METFORMIN HCL 500 MG PO TABS
500.0000 mg | ORAL_TABLET | Freq: Two times a day (BID) | ORAL | Status: DC
  Administered 2017-12-06 – 2017-12-08 (×4): 500 mg via ORAL
  Filled 2017-12-06 (×5): qty 1

## 2017-12-06 NOTE — Clinical Social Work Note (Signed)
CSW called and left a HIPPA compliant voice mail for Toys ''R'' UsEbonie Tashanti Dalporto alerting her to the patient's bed offers. CSW is awaiting call back.  Argentina PonderKaren Martha Montario Zilka, MSW, Theresia MajorsLCSWA [phone]

## 2017-12-06 NOTE — Progress Notes (Signed)
Patient resting in bed. Dressing dry and intact. Foley in place by urologist. No complaints of pain. Continue to monitor.

## 2017-12-06 NOTE — Progress Notes (Signed)
Pt alert and oriented. NO complaints of pain over night. Surgical dressing remaining dry and intact. Foley patent and draining urine.

## 2017-12-06 NOTE — Progress Notes (Signed)
[facility] [place] [facility]   PATIENT NAME: Reginald Hall    MR#:  [id number]  DATE OF BIRTH:  09/18/1965  SUBJECTIVE:  CHIEF COMPLAINT:  Pt was seen and examined . Feeling ok, tolerating diet  REVIEW OF SYSTEMS:  CONSTITUTIONAL: No fever, fatigue or weakness.  EYES: No blurred or double vision.  EARS, NOSE, AND THROAT: No tinnitus or ear pain.  RESPIRATORY: No cough, shortness of breath, wheezing or hemoptysis.  CARDIOVASCULAR: No chest pain, orthopnea, edema.  GASTROINTESTINAL: No nausea, vomiting, diarrhea or abdominal pain.  GENITOURINARY: No dysuria, hematuria.  ENDOCRINE: No polyuria, nocturia,  HEMATOLOGY: No anemia, easy bruising or bleeding SKIN: No rash or lesion. MUSCULOSKELETAL: Right hip  pain NEUROLOGIC: No tingling, numbness, weakness.  PSYCHIATRY: No anxiety or depression.   DRUG ALLERGIES:   Allergies  Allergen Reactions  . Other     GLUTEN INTOLERANCE    VITALS:  Blood pressure 112/62, pulse 93, temperature 98.3 F (36.8 C), temperature source Oral, resp. rate 16, height 4' 9.99" (1.473 m), weight 41.2 kg (90 lb 13.3 oz), SpO2 100 %.  PHYSICAL EXAMINATION:  GENERAL:  52 y.o.-year-old patient lying in the bed with no acute distress.  EYES: Pupils equal, round, reactive to light and accommodation. No scleral icterus. Extraocular muscles intact.  HEENT: Head atraumatic, normocephalic. Oropharynx and nasopharynx clear.  NECK:  Supple, no jugular venous distention. No thyroid enlargement, no tenderness.  LUNGS: Normal breath sounds bilaterally, no wheezing, rales,rhonchi or crepitation. No use of accessory muscles of respiration.  CARDIOVASCULAR: S1, S2 normal. No murmurs, rubs, or gallops.  ABDOMEN: Soft, nontender, nondistended. Bowel sounds present. EXTREMITIES: Right hip with clean honeycomb dressing and min tender  nEUROLOGIC: Awake, alert and oriented x3 sensation intact. Gait not checked.  Mentally  challenged PSYCHIATRIC: The patient is alert and oriented x 3.  SKIN: No obvious rash, lesion, or ulcer.    LABORATORY PANEL:   CBC Recent Labs  Lab 12/06/17 0522  WBC 7.5  HGB 11.4*  HCT 32.4*  PLT 212   ------------------------------------------------------------------------------------------------------------------  Chemistries  Recent Labs  Lab 12/05/17 0539 12/06/17 0522  NA 131* 132*  K 3.2* 4.6  CL 103 104  CO2 20* 20*  GLUCOSE 190* 203*  BUN 11 12  CREATININE 0.57* 0.48*  CALCIUM 8.2* 7.9*  MG 1.8  --    ------------------------------------------------------------------------------------------------------------------  Cardiac Enzymes No results for input(s): TROPONINI in the last 168 hours. ------------------------------------------------------------------------------------------------------------------  RADIOLOGY:  Dg Hip Port Unilat With Pelvis 1v Right  Result Date: 12/05/2017 CLINICAL DATA:  Status post right hip fracture fixation. EXAM: DG HIP (WITH OR WITHOUT PELVIS) 1V PORT RIGHT COMPARISON:  12/04/2017 FINDINGS: There has been interval repair of intertrochanteric right femoral fracture with intramedullary nail and screw placement. The alignment is anatomic. Expected postsurgical changes noted. IMPRESSION: Status post open reduction internal fixation of right intertrochanteric fracture without evidence of immediate complications. Electronically Signed   By: Ted Mcalpineobrinka  Dimitrova M.D.   On: 12/05/2017 12:44   Dg Hip Operative Unilat W Or W/o Pelvis Right  Result Date: 12/05/2017 CLINICAL DATA:  Intraoperative images from open reduction internal fixation of right intertrochanteric femoral fracture. EXAM: OPERATIVE RIGHT HIP (WITH PELVIS IF PERFORMED) TECHNIQUE: Fluoroscopic spot image(s) were submitted for interpretation post-operatively. COMPARISON:  12/04/2017 FINDINGS: Multiple fluoroscopic intraoperative images from intramedullary pin and screw fixation of  right intertrochanteric femoral fracture demonstrate normal alignment of the orthopedic hardware. No new fractures are seen. Fluoroscopy time is recorded as 52 seconds. IMPRESSION: Fluoroscopic  images from open reduction internal fixation of right intertrochanteric femoral fracture without evidence of immediate complications. Electronically Signed   By: Ted Mcalpine M.D.   On: 12/05/2017 13:05    EKG:   Orders placed or performed during the hospital encounter of 12/04/17  . ED EKG  . ED EKG  . EKG 12-Lead  . EKG 12-Lead  . EKG    ASSESSMENT AND PLAN:    *Intertrochanteric right femur fracture postop day #1 Postop care by Dr. Martha Clan Pain medications added. Check a.m. Labs  *Hyponatremia from dehydration from diarrhea  hydrate with IV fluids and check a.m. labs  *Watery diarrhea   stool for C. difficile toxin patient is on enteric precautions Diarrhea resolved per nurse  *Diabetes mellitus.  Continue home medications.  Sliding scale insulin.  *Chronic mild hyponatremia.  Monitor, Sodium 131  *Hypertension.  Continue medications.  Hydralazine IV as needed added  Hypokalemia resolved  Disposition  pending PASSAR  All the records are reviewed and case discussed with Care Management/Social Workerr. Management plans discussed with the patient, brother at bed side and they are in agreement.  CODE STATUS: prior  TOTAL TIME TAKING CARE OF THIS PATIENT: 35 minutes.   POSSIBLE D/C IN 2 DAYS, DEPENDING ON CLINICAL CONDITION.  Note: This dictation was prepared with Dragon dictation along with smaller phrase technology. Any transcriptional errors that result from this process are unintentional.   Ramonita Lab M.D on 12/06/2017 at 12:23 PM  Between 7am to 6pm - Pager - [phone] After 6pm go to [email or web address] - password [email or web address]  [facility]  Office  [phone]  CC: Primary care physician; Derwood Kaplan, MD

## 2017-12-06 NOTE — Progress Notes (Signed)
Physical Therapy Evaluation Patient Details Name: Reginald StallJeffrey M Ripberger MRN: [id number] DOB: 09/05/1965 Today's Date: 12/06/2017   History of Present Illness  Pt sufferred R femur fracture and is POD#1 s/p intertrochanteric fixation. No reports post-op complications. PMH includes MR, DM, and HTN.  Clinical Impression  Pt admitted with above diagnosis. Pt currently with functional limitations due to the deficits listed below (see PT Problem List).  Pt requires cues to assist with transfers to EOB. HOB elevated and use of bed rails. Steady in sitting at EOB without external support. Pt requires cues for hand placement during sit to stand transfers. MinA+1 to stabilize in standing. Decreased weight shifting to RLE. Pt able to take short, shuffling steps from bed to recliner. Cues for sequencing with walker and assist for balance. He is able to complete all bed exercises as instructed. Pt will benefit from PT services to address deficits in strength, balance, and mobility in order to return to full function at home.     Follow Up Recommendations SNF    Equipment Recommendations  Rolling walker with 5" wheels;Other (comment)([facility]    Recommendations for Other Services       Precautions / Restrictions Precautions Precautions: Fall Restrictions Weight Bearing Restrictions: Yes      Mobility  Bed Mobility Overal bed mobility: Needs Assistance Bed Mobility: Supine to Sit     Supine to sit: Min assist     General bed mobility comments: Pt requires cues to assist with transfers to EOB. HOB elevated and use of bed rails. Steady in sitting at EOB without external support  Transfers Overall transfer level: Needs assistance Equipment used: Rolling walker (2 wheeled) Transfers: Sit to/from Stand Sit to Stand: Min assist         General transfer comment: Pt requires cues for hand placement. MinA+1 to stabilize in standing. Decreased weight shifting to  RLE  Ambulation/Gait Ambulation/Gait assistance: Min assist Gait Distance (Feet): 3 Feet Assistive device: Rolling walker (2 wheeled)   Gait velocity: Below functional limits for household mobility   General Gait Details: Pt able to take short, shuffling steps from bed to recliner. Cues for sequencing with walker and assist for balance  Stairs            Wheelchair Mobility    Modified Rankin (Stroke Patients Only)       Balance Overall balance assessment: Needs assistance Sitting-balance support: No upper extremity supported Sitting balance-Leahy Scale: Good     Standing balance support: Bilateral upper extremity supported Standing balance-Leahy Scale: Poor Standing balance comment: Requires UE support on walker in standing and mina+1 to stabilize                             Pertinent Vitals/Pain Pain Assessment: Faces Faces Pain Scale: Hurts a little bit Pain Location: R hip with mobility. Pt denies pain at rest and demonstrates some very minimal signs of pain with mobility. Defers pain medication Pain Descriptors / Indicators: Grimacing Pain Intervention(s): Monitored during session    Home Living Family/patient expects to be discharged to:: Group home                      Prior Function Level of Independence: Needs assistance   Gait / Transfers Assistance Needed: Pt previously ambulating without any assistive device. No other falls in the last 12 months reported by patient or brother  ADL's / Homemaking Assistance Needed: Requiring assist  for ADLs/IADLs at group home        Hand Dominance        Extremity/Trunk Assessment   Upper Extremity Assessment Upper Extremity Assessment: Overall WFL for tasks assessed    Lower Extremity Assessment Lower Extremity Assessment: RLE deficits/detail RLE Deficits / Details: Pt able to perform R SLR without assistance. Full DF/PF bilaterally. Unable to get response from patient regarding  sensation       Communication   Communication: No difficulties  Cognition Arousal/Alertness: Awake/alert Behavior During Therapy: WFL for tasks assessed/performed Overall Cognitive Status: History of cognitive impairments - at baseline                                 General Comments: He is AOx3, not fully oriented to situation and answers are not always appropriate to questions      General Comments      Exercises General Exercises - Lower Extremity Ankle Circles/Pumps: Both;10 reps Short Arc Quad: Right;10 reps Heel Slides: Right;10 reps Hip ABduction/ADduction: Right;10 reps Straight Leg Raises: Right;10 reps   Assessment/Plan    PT Assessment Patient needs continued PT services  PT Problem List Decreased strength;Decreased activity tolerance;Decreased balance       PT Treatment Interventions DME instruction;Gait training;Functional mobility training;Therapeutic activities;Therapeutic exercise    PT Goals (Current goals can be found in the Care Plan section)  Acute Rehab PT Goals Patient Stated Goal: Return to walking PT Goal Formulation: With patient Time For Goal Achievement: 12/20/17 Potential to Achieve Goals: Good    Frequency BID   Barriers to discharge        Co-evaluation               AM-PAC PT "6 Clicks" Daily Activity  Outcome Measure Difficulty turning over in bed (including adjusting bedclothes, sheets and blankets)?: Unable Difficulty moving from lying on back to sitting on the side of the bed? : Unable Difficulty sitting down on and standing up from a chair with arms (e.g., wheelchair, bedside commode, etc,.)?: Unable Help needed moving to and from a bed to chair (including a wheelchair)?: A Little Help needed walking in hospital room?: A Lot Help needed climbing 3-5 steps with a railing? : Total 6 Click Score: 9    End of Session Equipment Utilized During Treatment: Gait belt Activity Tolerance: Patient tolerated  treatment well Patient left: in chair;with call bell/phone within reach;with chair alarm set Nurse Communication: Mobility status PT Visit Diagnosis: Unsteadiness on feet (R26.81);Muscle weakness (generalized) (M62.81);History of falling (Z91.81)    Time: 1610-9604 PT Time Calculation (min) (ACUTE ONLY): 25 min   Charges:   PT Evaluation $PT Eval Low Complexity: 1 Low PT Treatments $Therapeutic Exercise: 8-22 mins   PT G Codes:        Serria Sloma D Janyla Biscoe PT, DPT, GCS   Mosella Kasa 12/06/2017, 3:09 PM

## 2017-12-06 NOTE — Progress Notes (Signed)
Patient sitting up in chair, eating dinner. Dressing dry and intact. No complaints of pain. No signs or complaints of distress. Foley in place and patent. Continue to monitor.

## 2017-12-06 NOTE — Consult Note (Signed)
MEDICATION RELATED CONSULT NOTE   Pharmacy Consult for electrolytes Indication: hypokalemia  Allergies  Allergen Reactions  . Other     GLUTEN INTOLERANCE    Patient Measurements: Height: 4' 9.99" (147.3 cm) Weight: 90 lb 13.3 oz (41.2 kg) IBW/kg (Calculated) : 45.38 Adjusted Body Weight:   Vital Signs: Temp: 98.3 F (36.8 C) (06/30 0742) Temp Source: Oral (06/30 0742) BP: 112/62 (06/30 0742) Pulse Rate: 93 (06/30 0742) Intake/Output from previous day: 06/29 0701 - 06/30 0700 In: 1600 [P.O.:50; I.V.:1100; IV Piggyback:450] Out: 1125 [Urine:1100; Blood:25] Intake/Output from this shift: Total I/O In: 240 [P.O.:240] Out: -   Labs: Recent Labs    12/04/17 1026 12/04/17 1944 12/05/17 0539 12/06/17 0522  WBC 4.4  --  5.3 7.5  HGB 12.1*  --  12.3* 11.4*  HCT 34.2*  --  34.3* 32.4*  PLT 210  --  224 212  APTT  --  29  --   --   CREATININE 0.73  --  0.57* 0.48*  MG  --   --  1.8  --    Estimated Creatinine Clearance: 61.5 mL/min (A) (by C-G formula based on SCr of 0.48 mg/dL (L)).   Microbiology: Recent Results (from the past 720 hour(s))  MRSA PCR Screening     Status: Abnormal   Collection Time: 12/04/17  2:01 PM  Result Value Ref Range Status   MRSA by PCR POSITIVE (A) NEGATIVE Final    Comment:        The GeneXpert MRSA Assay (FDA approved for NASAL specimens only), is one component of a comprehensive MRSA colonization surveillance program. It is not intended to diagnose MRSA infection nor to guide or monitor treatment for MRSA infections. RESULT CALLED TO, READ BACK BY AND VERIFIED WITH: CORY HUDSON 12/04/17 AT 1538 BY HS Performed at [facility], [address] [place], [place] [address]     Medications:  Scheduled:  . acetaminophen  500 mg Oral Q6H  . Chlorhexidine Gluconate Cloth  6 each Topical Q0600  . docusate sodium  100 mg Oral BID  . enoxaparin (LOVENOX) injection  30 mg Subcutaneous Q24H  . glimepiride  2 mg Oral Q  breakfast  . insulin aspart  0-15 Units Subcutaneous TID WC  . insulin aspart  0-5 Units Subcutaneous QHS  . ketorolac  7.5 mg Intravenous Q6H  . losartan  50 mg Oral BH-[id number]  . mupirocin ointment  1 application Nasal BID  . pantoprazole  40 mg Oral BID    Assessment: Pt is a 52 year old male admitted with hip fracture and hypokalemia. Pt having watery diarrhea. K on admission was 2.5. Pt received 140 MEQ of KCL yesterday and another 20 MEQ this AM. K up to 3.2. Add on Mg was 1.8.  Goal of Therapy:  K=3.5-5  Plan:  K 4.6  Scr 0.48 Recheck in the AM  Bari MantisKristin Dalicia Kisner PharmD Clinical Pharmacist 12/06/2017

## 2017-12-06 NOTE — Progress Notes (Signed)
Subjective:  POD #1 status post intramedullary fixation of right intertrochanteric hip fracture.  Patient reports right hip pain as mild.  Patient has no other complaints.  The nurse reports he has had no further diarrhea after the OR.  Foley catheter placed by urologist in OR yesterday.  Objective:   VITALS:   Vitals:   12/05/17 2300 12/05/17 2322 12/06/17 0407 12/06/17 0742  BP: 114/79 (!) 117/91 118/79 112/62  Pulse: 91 88 96 93  Resp: 18 16  16   Temp: 97.9 F (36.6 C) 97.8 F (36.6 C) 97.8 F (36.6 C) 98.3 F (36.8 C)  TempSrc: Oral  Oral Oral  SpO2: 99% 99% 99% 100%  Weight:      Height:        PHYSICAL EXAM: Right lower extremity: Neurovascular intact Sensation intact distally Intact pulses distally Dorsiflexion/Plantar flexion intact Incision: scant drainage No cellulitis present Compartment soft  LABS  Results for orders placed or performed during the hospital encounter of 12/04/17 (from the past 24 hour(s))  Glucose, capillary     Status: Abnormal   Collection Time: 12/05/17 11:43 AM  Result Value Ref Range   Glucose-Capillary 103 (H) 70 - 99 mg/dL  Glucose, capillary     Status: Abnormal   Collection Time: 12/05/17  4:48 PM  Result Value Ref Range   Glucose-Capillary 267 (H) 70 - 99 mg/dL  Glucose, capillary     Status: Abnormal   Collection Time: 12/05/17  9:02 PM  Result Value Ref Range   Glucose-Capillary 209 (H) 70 - 99 mg/dL   Comment 1 Notify RN   CBC     Status: Abnormal   Collection Time: 12/06/17  5:22 AM  Result Value Ref Range   WBC 7.5 3.8 - 10.6 K/uL   RBC 3.28 (L) 4.40 - 5.90 MIL/uL   Hemoglobin 11.4 (L) 13.0 - 18.0 g/dL   HCT 16.1 (L) 09.6 - 04.5 %   MCV 98.9 80.0 - 100.0 fL   MCH 34.9 (H) 26.0 - 34.0 pg   MCHC 35.3 32.0 - 36.0 g/dL   RDW 40.9 81.1 - 91.4 %   Platelets 212 150 - 440 K/uL  Basic metabolic panel     Status: Abnormal   Collection Time: 12/06/17  5:22 AM  Result Value Ref Range   Sodium 132 (L) 135 - 145 mmol/L   Potassium 4.6 3.5 - 5.1 mmol/L   Chloride 104 98 - 111 mmol/L   CO2 20 (L) 22 - 32 mmol/L   Glucose, Bld 203 (H) 70 - 99 mg/dL   BUN 12 6 - 20 mg/dL   Creatinine, Ser 7.82 (L) 0.61 - 1.24 mg/dL   Calcium 7.9 (L) 8.9 - 10.3 mg/dL   GFR calc non Af Amer >60 >60 mL/min   GFR calc Af Amer >60 >60 mL/min   Anion gap 8 5 - 15  Glucose, capillary     Status: Abnormal   Collection Time: 12/06/17  7:40 AM  Result Value Ref Range   Glucose-Capillary 205 (H) 70 - 99 mg/dL   Comment 1 Notify RN     Dg Hip Port Unilat With Pelvis 1v Right  Result Date: 12/05/2017 CLINICAL DATA:  Status post right hip fracture fixation. EXAM: DG HIP (WITH OR WITHOUT PELVIS) 1V PORT RIGHT COMPARISON:  12/04/2017 FINDINGS: There has been interval repair of intertrochanteric right femoral fracture with intramedullary nail and screw placement. The alignment is anatomic. Expected postsurgical changes noted. IMPRESSION: Status post open reduction internal fixation of  right intertrochanteric fracture without evidence of immediate complications. Electronically Signed   By: Ted Mcalpineobrinka  Dimitrova M.D.   On: 12/05/2017 12:44   Dg Hip Operative Unilat W Or W/o Pelvis Right  Result Date: 12/05/2017 CLINICAL DATA:  Intraoperative images from open reduction internal fixation of right intertrochanteric femoral fracture. EXAM: OPERATIVE RIGHT HIP (WITH PELVIS IF PERFORMED) TECHNIQUE: Fluoroscopic spot image(s) were submitted for interpretation post-operatively. COMPARISON:  12/04/2017 FINDINGS: Multiple fluoroscopic intraoperative images from intramedullary pin and screw fixation of right intertrochanteric femoral fracture demonstrate normal alignment of the orthopedic hardware. No new fractures are seen. Fluoroscopy time is recorded as 52 seconds. IMPRESSION: Fluoroscopic images from open reduction internal fixation of right intertrochanteric femoral fracture without evidence of immediate complications. Electronically Signed   By: Ted Mcalpineobrinka   Dimitrova M.D.   On: 12/05/2017 13:05    Assessment/Plan: 1 Day Post-Op   Active Problems:   Closed right hip fracture (HCC)   Pressure injury of skin   Patient doing well postop.  He will begin Lovenox for DVT prophylaxis.  Early catheter will remain in place today.  Will be removed tomorrow per the urologist recommendation.  Patient will begin physical therapy.  Glucose elevated on today's labs but otherwise his hemoglobin and hematocrit are stable.  Patient will need a skilled nursing facility stay upon discharge.   Reginald Hall, Maya Arcand , MD 12/06/2017, 11:01 AM

## 2017-12-07 LAB — CBC
HCT: 30.1 % — ABNORMAL LOW (ref 40.0–52.0)
Hemoglobin: 10.9 g/dL — ABNORMAL LOW (ref 13.0–18.0)
MCH: 35.9 pg — AB (ref 26.0–34.0)
MCHC: 36.2 g/dL — AB (ref 32.0–36.0)
MCV: 99.1 fL (ref 80.0–100.0)
PLATELETS: 242 10*3/uL (ref 150–440)
RBC: 3.04 MIL/uL — AB (ref 4.40–5.90)
RDW: 13.5 % (ref 11.5–14.5)
WBC: 10.4 10*3/uL (ref 3.8–10.6)

## 2017-12-07 LAB — BASIC METABOLIC PANEL
Anion gap: 7 (ref 5–15)
BUN: 11 mg/dL (ref 6–20)
CALCIUM: 7.6 mg/dL — AB (ref 8.9–10.3)
CO2: 24 mmol/L (ref 22–32)
CREATININE: 0.34 mg/dL — AB (ref 0.61–1.24)
Chloride: 99 mmol/L (ref 98–111)
GFR calc Af Amer: >60 mL/min (ref >60)
GLUCOSE: 165 mg/dL — AB (ref 70–99)
POTASSIUM: 3.5 mmol/L (ref 3.5–5.1)
SODIUM: 130 mmol/L — AB (ref 135–145)

## 2017-12-07 LAB — GLUCOSE, CAPILLARY
GLUCOSE-CAPILLARY: 165 mg/dL — AB (ref 70–99)
GLUCOSE-CAPILLARY: 82 mg/dL (ref 70–99)
Glucose-Capillary: 191 mg/dL — ABNORMAL HIGH (ref 70–99)
Glucose-Capillary: 142 mg/dL — ABNORMAL HIGH (ref 70–99)

## 2017-12-07 MED ORDER — SODIUM CHLORIDE 1 G PO TABS
1.0000 g | ORAL_TABLET | Freq: Three times a day (TID) | ORAL | Status: DC
  Administered 2017-12-07 – 2017-12-08 (×4): 1 g via ORAL
  Filled 2017-12-07 (×5): qty 1

## 2017-12-07 MED ORDER — POTASSIUM CHLORIDE CRYS ER 20 MEQ PO TBCR
20.0000 meq | EXTENDED_RELEASE_TABLET | Freq: Once | ORAL | Status: AC
  Administered 2017-12-07: 20 meq via ORAL
  Filled 2017-12-07: qty 1

## 2017-12-07 NOTE — Progress Notes (Addendum)
PASARR has been received [phone] E expires on 01/06/18. Clinical Child psychotherapistocial Worker (CSW) contacted patient's DSS guardian Christena Deembonie White and presented bed offers. She chose [facility]. Tammy admissions coordinator at [facility] is aware of accepted bed offer. [facility] Haywood LassoLynette is aware of above. Medicare bundle case manager is aware of above.   [employer] Inc[employer]Bailey Jamarria Real, LCSW [phone]

## 2017-12-07 NOTE — Progress Notes (Signed)
[facility] - [place] [facility]   PATIENT NAME: Reginald PontoJeffrey Hall    MR#:  [id number]  DATE OF BIRTH:  03/23/1966  SUBJECTIVE:  CHIEF COMPLAINT:  Pt was seen and examined . Foley cath removed at noon time, not voided at positive bowel movement.  Working with physical therapy  REVIEW OF SYSTEMS:  CONSTITUTIONAL: No fever, fatigue or weakness.  EYES: No blurred or double vision.  EARS, NOSE, AND THROAT: No tinnitus or ear pain.  RESPIRATORY: No cough, shortness of breath, wheezing or hemoptysis.  CARDIOVASCULAR: No chest pain, orthopnea, edema.  GASTROINTESTINAL: No nausea, vomiting, diarrhea or abdominal pain.  GENITOURINARY: No dysuria, hematuria.  ENDOCRINE: No polyuria, nocturia,  HEMATOLOGY: No anemia, easy bruising or bleeding SKIN: No rash or lesion. MUSCULOSKELETAL: Right hip  pain NEUROLOGIC: No tingling, numbness, weakness.  PSYCHIATRY: No anxiety or depression.   DRUG ALLERGIES:   Allergies  Allergen Reactions  . Other     GLUTEN INTOLERANCE    VITALS:  Blood pressure 122/65, pulse (!) 113, temperature 97.8 F (36.6 C), temperature source Oral, resp. rate (!) 22, height 4' 9.99" (1.473 m), weight 43.1 kg (95 lb), SpO2 100 %.  PHYSICAL EXAMINATION:  GENERAL:  52 y.o.-year-old patient lying in the bed with no acute distress.  EYES: Pupils equal, round, reactive to light and accommodation. No scleral icterus. Extraocular muscles intact.  HEENT: Head atraumatic, normocephalic. Oropharynx and nasopharynx clear.  NECK:  Supple, no jugular venous distention. No thyroid enlargement, no tenderness.  LUNGS: Normal breath sounds bilaterally, no wheezing, rales,rhonchi or crepitation. No use of accessory muscles of respiration.  CARDIOVASCULAR: S1, S2 normal. No murmurs, rubs, or gallops.  ABDOMEN: Soft, nontender, nondistended. Bowel sounds present. EXTREMITIES: Right hip with clean honeycomb dressing and min tender  nEUROLOGIC: Awake, alert and  oriented x3 sensation intact. Gait not checked.  Mentally challenged PSYCHIATRIC: The patient is alert and oriented x 3.  SKIN: No obvious rash, lesion, or ulcer.    LABORATORY PANEL:   CBC Recent Labs  Lab 12/07/17 0618  WBC 10.4  HGB 10.9*  HCT 30.1*  PLT 242   ------------------------------------------------------------------------------------------------------------------  Chemistries  Recent Labs  Lab 12/05/17 0539  12/07/17 0618  NA 131*   < > 130*  K 3.2*   < > 3.5  CL 103   < > 99  CO2 20*   < > 24  GLUCOSE 190*   < > 165*  BUN 11   < > 11  CREATININE 0.57*   < > 0.34*  CALCIUM 8.2*   < > 7.6*  MG 1.8  --   --    < > = values in this interval not displayed.   ------------------------------------------------------------------------------------------------------------------  Cardiac Enzymes No results for input(s): TROPONINI in the last 168 hours. ------------------------------------------------------------------------------------------------------------------  RADIOLOGY:  No results found.  EKG:   Orders placed or performed during the hospital encounter of 12/04/17  . ED EKG  . ED EKG  . EKG 12-Lead  . EKG 12-Lead  . EKG  . EKG 12-Lead  . EKG 12-Lead    ASSESSMENT AND PLAN:    *Intertrochanteric right femur fracture postop day #1 Postop care by Dr. Martha ClanKrasinski Pain medications added. Check a.m. Labs  *Hyponatremia from dehydration from diarrhea  hydrate with IV fluids and sodium chloride tablets added to the regimen Check a.m. Labs Sodium was normal in October 2017 at 139  *Watery diarrhea   stool for C. difficile toxin patient is on enteric precautions Diarrhea  resolved per nurse  *Diabetes mellitus.  Continue home medications.  Sliding scale insulin.   *Hypertension.  Continue medications.  Hydralazine IV as needed added  Hypokalemia resolved  * urinary retention- foley cath placed by urology dr.Bell . D/ced today , awaiting  voiding trial  Disposition  -SNF tomorrow [facility] improved  All the records are reviewed and case discussed with Care Management/Social Workerr. Management plans discussed with the patient, brother at bed side and they are in agreement.  CODE STATUS: DSS  TOTAL TIME TAKING CARE OF THIS PATIENT: 35 minutes.   POSSIBLE D/C IN 2 DAYS, DEPENDING ON CLINICAL CONDITION.  Note: This dictation was prepared with Dragon dictation along with smaller phrase technology. Any transcriptional errors that result from this process are unintentional.   Ramonita Lab M.D on 12/07/2017 at 3:01 PM  Between 7am to 6pm - Pager - [phone] After 6pm go to [email or web address] - password [email or web address] [facility]  [place] [facility]  Office  [phone]  CC: Primary care physician; Derwood Kaplan, MD

## 2017-12-07 NOTE — Progress Notes (Signed)
Physical Therapy Treatment Patient Details Name: Reginald Hall MRN: [id number] DOB: 04-03-1966 Today's Date: 12/07/2017    History of Present Illness Pt sufferred R femur fracture and is POD#1 s/p intertrochanteric fixation. No reports post-op complications. PMH includes MR, DM, and HTN.    PT Comments    Pt eager for PT and to get out of bed/up to chair. Pt extremely pleasant, reports "a little to a lot" of pain; demonstrates little throughout session. Pt improving with bed mobility requiring no physical assist, but increased time and effort along with cues. Pt has little safety awareness and is unsteady in stand requiring Min A. Pt participates in seated and long sit exercises demonstrating partial active range of motion on R throughout with great joy after completing each exercise. Poor quad set noted bilaterally. Continue PT to progress range, strength, balance and safety to improve all functional mobility.    Follow Up Recommendations  SNF     Equipment Recommendations  Rolling walker with 5" wheels;Other (comment)(may require youth size)    Recommendations for Other Services       Precautions / Restrictions Precautions Precautions: Fall Restrictions Weight Bearing Restrictions: Yes(WBAT) RLE Weight Bearing: Weight bearing as tolerated    Mobility  Bed Mobility Overal bed mobility: Needs Assistance Bed Mobility: Supine to Sit     Supine to sit: Min guard;HOB elevated;Supervision(cues for initiation of movement; increased time/effort)     General bed mobility comments: Improvement needing less assist  Transfers Overall transfer level: Needs assistance Equipment used: Rolling walker (2 wheeled) Transfers: Sit to/from Stand Sit to Stand: Min assist         General transfer comment: Cues for hand placement and assist for balance/stability once in stand. Weight bearing through RLE improved and in fact demonstrates LLE in a more flexed/shortened position with  stand/gait  Ambulation/Gait Ambulation/Gait assistance: Min assist Gait Distance (Feet): 6 Feet Assistive device: Rolling walker (2 wheeled) Gait Pattern/deviations: Step-to pattern Gait velocity: Below functional limits for household mobility   General Gait Details: Pt mildly unsteady requiring assist. Pt demonstrating improved wt shift to the R with LLE held in a more flexed position throughout gait. Short choppy steps. Premature attempt to sit. Pt needs a shorter rw   Stairs             Wheelchair Mobility    Modified Rankin (Stroke Patients Only)       Balance Overall balance assessment: Needs assistance Sitting-balance support: Bilateral upper extremity supported;Feet supported Sitting balance-Leahy Scale: Good     Standing balance support: Bilateral upper extremity supported Standing balance-Leahy Scale: Poor                              Cognition Arousal/Alertness: Awake/alert Behavior During Therapy: WFL for tasks assessed/performed Overall Cognitive Status: History of cognitive impairments - at baseline                                 General Comments: Aware of situation, but less aware of impact on mobility/no real safety awareness      Exercises General Exercises - Lower Extremity Ankle Circles/Pumps: AROM;Both;10 reps Quad Sets: Strengthening;Both;10 reps(does not reach full extension on either side) Long Arc Quad: AROM;Right;10 reps(partial range) Heel Slides: AROM;Both;10 reps Hip ABduction/ADduction: AAROM;Both;10 reps    General Comments        Pertinent Vitals/Pain Pain Assessment: (Reports pain  as "a little and a lot") Pain Location: RLE, mild grimacing with mobility, but no significant overt signs of pain/distress. Smiling throughout session Pain Descriptors / Indicators: Sore Pain Intervention(s): Monitored during session;Repositioned    Home Living                      Prior Function             PT Goals (current goals can now be found in the care plan section) Progress towards PT goals: Progressing toward goals    Frequency    BID      PT Plan Current plan remains appropriate    Co-evaluation              AM-PAC PT "6 Clicks" Daily Activity  Outcome Measure  Difficulty turning over in bed (including adjusting bedclothes, sheets and blankets)?: A Little Difficulty moving from lying on back to sitting on the side of the bed? : A Little Difficulty sitting down on and standing up from a chair with arms (e.g., wheelchair, bedside commode, etc,.)?: Unable Help needed moving to and from a bed to chair (including a wheelchair)?: A Little Help needed walking in hospital room?: A Lot Help needed climbing 3-5 steps with a railing? : A Lot 6 Click Score: 14    End of Session Equipment Utilized During Treatment: Gait belt Activity Tolerance: Patient tolerated treatment well Patient left: in chair;with call bell/phone within reach;with chair alarm set Nurse Communication: Other (comment)(spoke with MD re: mobility/discharge plans) PT Visit Diagnosis: Unsteadiness on feet (R26.81);Muscle weakness (generalized) (M62.81);History of falling (Z91.81)     Time: 1040-1107 PT Time Calculation (min) (ACUTE ONLY): 27 min  Charges:  $Gait Training: 8-22 mins $Therapeutic Exercise: 8-22 mins                    G Codes:        Scot DockHeidi E Jezebel Pollet, PTA 12/07/2017, 11:23 AM

## 2017-12-07 NOTE — Progress Notes (Signed)
Anticoagulation monitoring(Lovenox):  52 yo  male ordered Lovenox 30 mg Q24h  Filed Weights   12/04/17 1335 12/05/17 0500 12/07/17 0254  Weight: 90 lb (40.8 kg) 90 lb 13.3 oz (41.2 kg) 95 lb (43.1 kg)    Body mass index is 19.86 kg/m.   Lab Results  Component Value Date   CREATININE 0.34 (L) 12/07/2017   CREATININE 0.48 (L) 12/06/2017   CREATININE 0.57 (L) 12/05/2017   Estimated Creatinine Clearance: 64.4 mL/min (A) (by C-G formula based on SCr of 0.34 mg/dL (L)). Hemoglobin & Hematocrit     Component Value Date/Time   HGB 10.9 (L) 12/07/2017 0618   HGB 9.7 (L) 11/23/2012 0420   HCT 30.1 (L) 12/07/2017 0618   HCT 27.6 (L) 11/21/2012 0431     Will continue at current ordered dose for weight <45 kg

## 2017-12-07 NOTE — Progress Notes (Addendum)
Subjective:  POD #2 is post intramedullary fixation for right intertrochanteric hip fracture.  Patient is up out of bed to a chair.  Patient denies any significant right hip pain.  Patient had a low sodium today but otherwise there are no acute issues.  Objective:   VITALS:   Vitals:   12/06/17 2254 12/07/17 0254 12/07/17 0745 12/07/17 1040  BP: 120/68  122/65   Pulse: (!) 108  (!) 107 (!) 113  Resp: 17  (!) 22   Temp: 97.7 F (36.5 C)  97.8 F (36.6 C)   TempSrc: Oral  Oral   SpO2: 95%  98% 100%  Weight:  43.1 kg (95 lb)    Height:        PHYSICAL EXAM: Right lower extremity: Neurovascular intact Sensation intact distally Intact pulses distally Dorsiflexion/Plantar flexion intact Incision: no drainage No cellulitis present Compartment soft  LABS  Results for orders placed or performed during the hospital encounter of 12/04/17 (from the past 24 hour(s))  Glucose, capillary     Status: Abnormal   Collection Time: 12/06/17  5:08 PM  Result Value Ref Range   Glucose-Capillary 245 (H) 70 - 99 mg/dL   Comment 1 Notify RN   Glucose, capillary     Status: Abnormal   Collection Time: 12/06/17  9:08 PM  Result Value Ref Range   Glucose-Capillary 202 (H) 70 - 99 mg/dL  CBC     Status: Abnormal   Collection Time: 12/07/17  6:18 AM  Result Value Ref Range   WBC 10.4 3.8 - 10.6 K/uL   RBC 3.04 (L) 4.40 - 5.90 MIL/uL   Hemoglobin 10.9 (L) 13.0 - 18.0 g/dL   HCT 75.6 (L) 43.3 - 29.5 %   MCV 99.1 80.0 - 100.0 fL   MCH 35.9 (H) 26.0 - 34.0 pg   MCHC 36.2 (H) 32.0 - 36.0 g/dL   RDW 18.8 41.6 - 60.6 %   Platelets 242 150 - 440 K/uL  Basic metabolic panel     Status: Abnormal   Collection Time: 12/07/17  6:18 AM  Result Value Ref Range   Sodium 130 (L) 135 - 145 mmol/L   Potassium 3.5 3.5 - 5.1 mmol/L   Chloride 99 98 - 111 mmol/L   CO2 24 22 - 32 mmol/L   Glucose, Bld 165 (H) 70 - 99 mg/dL   BUN 11 6 - 20 mg/dL   Creatinine, Ser 3.01 (L) 0.61 - 1.24 mg/dL   Calcium 7.6  (L) 8.9 - 10.3 mg/dL   GFR calc non Af Amer >60 >60 mL/min   GFR calc Af Amer >60 >60 mL/min   Anion gap 7 5 - 15  Glucose, capillary     Status: Abnormal   Collection Time: 12/07/17  7:48 AM  Result Value Ref Range   Glucose-Capillary 165 (H) 70 - 99 mg/dL  Glucose, capillary     Status: Abnormal   Collection Time: 12/07/17 11:59 AM  Result Value Ref Range   Glucose-Capillary 191 (H) 70 - 99 mg/dL    No results found.  Assessment/Plan: 2 Days Post-Op   Active Problems:   Closed right hip fracture (HCC)   Pressure injury of skin  Patient is doing well postop.  His Foley catheter was removed today.  He is due to void.  Continue physical therapy.  Patient being treated for his hyponatremia.  Upon discharge he will be going to [facility].  The patient is weightbearing as tolerated on the right lower extremity.  I recommend Lovenox daily for DVT prophylaxis is 4 weeks postop.   Juanell FairlyKRASINSKI, Amera Banos , MD 12/07/2017, 2:01 PM

## 2017-12-07 NOTE — Progress Notes (Signed)
Physical Therapy Treatment Patient Details Name: Reginald Hall MRN: [id number] DOB: [birth year] Today's Date: 12/07/2017    History of Present Illness Pt sufferred R femur fracture and is POD#1 s/p intertrochanteric fixation. No reports post-op complications. PMH includes MR, DM, and HTN.    PT Comments    Initial attempt in the afternoon, pt requiring change and personal hygiene. On second attempt, pt agreeable and continues comfortable up in chair. Pt participates in long sit exercises well with and without assist. Pt wishes to remain in chair and does not wish to get up at this time even with explanation that pt can return to chair. Pt notes "I just did" when discussing getting up from chair, so may be tired from movement needed to change pt. Pt is able to demonstrate active motion on RLE with improved range with assist. Therapist attempted to offer seated position for a couple of exercises, but pt states "that's it; I have done enough". No further attempt; continue PT to progress range, strength, endurance, balance and safety to improve all functional mobility.    Follow Up Recommendations  SNF     Equipment Recommendations  Rolling walker with 5" wheels;Other (comment)(may require youth size)    Recommendations for Other Services       Precautions / Restrictions Precautions Precautions: Fall Restrictions Weight Bearing Restrictions: Yes(WBAT)    Mobility  Bed Mobility                  Transfers                 General transfer comment: Pleasantly declines; just wants to stay in chair (was recently changed and seems to not be moved around too much)  Ambulation/Gait                 Stairs             Wheelchair Mobility    Modified Rankin (Stroke Patients Only)       Balance                                            Cognition Arousal/Alertness: Awake/alert Behavior During Therapy: WFL for tasks  assessed/performed Overall Cognitive Status: History of cognitive impairments - at baseline                                        Exercises General Exercises - Lower Extremity Ankle Circles/Pumps: AROM;Both;10 reps(2 sets) Quad Sets: Strengthening;Both;20 reps(does not reach full extension on either side) Short Arc Quad: AROM;Both;10 reps Heel Slides: AROM;Both;AAROM;20 reps(AROM on R partial; improved with assist; each 20x ) Hip ABduction/ADduction: AAROM;Both;10 reps    General Comments        Pertinent Vitals/Pain Pain Assessment: No/denies pain    Home Living                      Prior Function            PT Goals (current goals can now be found in the care plan section) Progress towards PT goals: Progressing toward goals    Frequency    BID      PT Plan Current plan remains appropriate    Co-evaluation  AM-PAC PT "6 Clicks" Daily Activity  Outcome Measure  Difficulty turning over in bed (including adjusting bedclothes, sheets and blankets)?: A Little Difficulty moving from lying on back to sitting on the side of the bed? : A Little Difficulty sitting down on and standing up from a chair with arms (e.g., wheelchair, bedside commode, etc,.)?: Unable Help needed moving to and from a bed to chair (including a wheelchair)?: A Little Help needed walking in hospital room?: A Lot Help needed climbing 3-5 steps with a railing? : A Lot 6 Click Score: 14    End of Session Equipment Utilized During Treatment: Gait belt Activity Tolerance: Patient tolerated treatment well Patient left: in chair;with call bell/phone within reach;with chair alarm set Nurse Communication: (spoke with MD re: mobility/discharge plans) PT Visit Diagnosis: Unsteadiness on feet (R26.81);Muscle weakness (generalized) (M62.81);History of falling (Z91.81)     Time: [id number] PT Time Calculation (min) (ACUTE ONLY): 24 min  Charges:  $Therapeutic  Exercise: 23-37 mins                    G CodesScot Dock:        Heidi E Barnes, PTA 12/07/2017, 4:07 PM

## 2017-12-07 NOTE — Consult Note (Signed)
MEDICATION RELATED CONSULT NOTE   Pharmacy Consult for electrolytes Indication: hypokalemia  Allergies  Allergen Reactions  . Other     GLUTEN INTOLERANCE    Patient Measurements: Height: 4' 9.99" (147.3 cm) Weight: 95 lb (43.1 kg) IBW/kg (Calculated) : 45.38 Adjusted Body Weight:   Vital Signs: Temp: 97.8 F (36.6 C) (07/01 0745) Temp Source: Oral (07/01 0745) BP: 122/65 (07/01 0745) Pulse Rate: 113 (07/01 1040) Intake/Output from previous day: 06/30 0701 - 07/01 0700 In: 240 [P.O.:240] Out: 2500 [Urine:2500] Intake/Output from this shift: Total I/O In: -  Out: 1500 [Urine:1500]  Labs: Recent Labs    12/04/17 1944 12/05/17 0539 12/06/17 0522 12/07/17 0618  WBC  --  5.3 7.5 10.4  HGB  --  12.3* 11.4* 10.9*  HCT  --  34.3* 32.4* 30.1*  PLT  --  224 212 242  APTT 29  --   --   --   CREATININE  --  0.57* 0.48* 0.34*  MG  --  1.8  --   --    Estimated Creatinine Clearance: 64.4 mL/min (A) (by C-G formula based on SCr of 0.34 mg/dL (L)).    Assessment: Pt is a 52 year old male admitted with hip fracture and hypokalemia. Pt having watery diarrhea. K on admission was 2.5. Pt received 140 MEQ of KCL yesterday and another 20 MEQ this AM. K up to 3.2. Add on Mg was 1.8.  Goal of Therapy:  K=3.5-5  Plan:  K 3.5  Scr 0.34 K trended down, pt has been having diarrhea, will order KCl 20 mEq PO x1 to stay WNL Recheck in the AM   Crist FatHannah Maxine Fredman, PharmD, BCPS Clinical Pharmacist 12/07/2017 1:15 PM

## 2017-12-08 LAB — CBC
HEMATOCRIT: 29.6 % — AB (ref 40.0–52.0)
HEMOGLOBIN: 10.5 g/dL — AB (ref 13.0–18.0)
MCH: 35.4 pg — ABNORMAL HIGH (ref 26.0–34.0)
MCHC: 35.3 g/dL (ref 32.0–36.0)
MCV: 100.1 fL — ABNORMAL HIGH (ref 80.0–100.0)
Platelets: 261 10*3/uL (ref 150–440)
RBC: 2.96 MIL/uL — ABNORMAL LOW (ref 4.40–5.90)
RDW: 13.5 % (ref 11.5–14.5)
WBC: 7.4 10*3/uL (ref 3.8–10.6)

## 2017-12-08 LAB — BASIC METABOLIC PANEL
Anion gap: 8 (ref 5–15)
BUN: 10 mg/dL (ref 6–20)
CALCIUM: 7.8 mg/dL — AB (ref 8.9–10.3)
CHLORIDE: 99 mmol/L (ref 98–111)
CO2: 26 mmol/L (ref 22–32)
Creatinine, Ser: 0.39 mg/dL — ABNORMAL LOW (ref 0.61–1.24)
GFR calc non Af Amer: >60 mL/min (ref >60)
Glucose, Bld: 180 mg/dL — ABNORMAL HIGH (ref 70–99)
Potassium: 3.9 mmol/L (ref 3.5–5.1)
SODIUM: 133 mmol/L — AB (ref 135–145)

## 2017-12-08 LAB — GLUCOSE, CAPILLARY
GLUCOSE-CAPILLARY: 190 mg/dL — AB (ref 70–99)
GLUCOSE-CAPILLARY: 171 mg/dL — AB (ref 70–99)

## 2017-12-08 LAB — MAGNESIUM: MAGNESIUM: 1.6 mg/dL — AB (ref 1.7–2.4)

## 2017-12-08 MED ORDER — ENOXAPARIN SODIUM 30 MG/0.3ML ~~LOC~~ SOLN: 30.0000 mg | SUBCUTANEOUS | 0 refills | Status: AC

## 2017-12-08 MED ORDER — ACETAMINOPHEN 325 MG PO TABS: 650.0000 mg | ORAL_TABLET | Freq: Four times a day (QID) | ORAL | Status: AC | PRN

## 2017-12-08 MED ORDER — METHOCARBAMOL 500 MG PO TABS: 500.0000 mg | ORAL_TABLET | Freq: Four times a day (QID) | ORAL | 0 refills | Status: AC | PRN

## 2017-12-08 MED ORDER — POLYETHYLENE GLYCOL 3350 17 G PO PACK: 17.0000 g | PACK | Freq: Every day | ORAL | 0 refills | Status: AC | PRN

## 2017-12-08 MED ORDER — SODIUM CHLORIDE 1 G PO TABS: 1.0000 g | ORAL_TABLET | Freq: Two times a day (BID) | ORAL | 0 refills | Status: AC

## 2017-12-08 MED ORDER — TRAMADOL HCL 50 MG PO TABS: 50.0000 mg | ORAL_TABLET | Freq: Three times a day (TID) | ORAL | 0 refills | Status: AC | PRN

## 2017-12-08 MED ORDER — MAGNESIUM SULFATE 2 GM/50ML IV SOLN
2.0000 g | Freq: Once | INTRAVENOUS | Status: AC
  Administered 2017-12-08: 2 g via INTRAVENOUS
  Filled 2017-12-08: qty 50

## 2017-12-08 NOTE — Progress Notes (Addendum)
  Subjective:  POD #3 s/p right IM fixation for IT hip fracture.  Patient denies right hip pain.  No other acute issues.  Objective:   VITALS:   Vitals:   12/07/17 1519 12/07/17 2336 12/08/17 0500 12/08/17 0856  BP: 114/76 124/89  122/85  Pulse: (!) 111 (!) 108  78  Resp: (!) 22 19  18   Temp: 98.6 F (37 C) 98.6 F (37 C)  98 F (36.7 C)  TempSrc: Oral Oral    SpO2: 100% 99%  100%  Weight:   44.5 kg (98 lb)   Height:        PHYSICAL EXAM: Right lower extremity: Neurovascular intact Sensation intact distally Intact pulses distally Dorsiflexion/Plantar flexion intact Incision: no drainage No cellulitis present Compartment soft  LABS  Results for orders placed or performed during the hospital encounter of 12/04/17 (from the past 24 hour(s))  Glucose, capillary     Status: Abnormal   Collection Time: 12/07/17  4:39 PM  Result Value Ref Range   Glucose-Capillary 142 (H) 70 - 99 mg/dL  Glucose, capillary     Status: None   Collection Time: 12/07/17  9:19 PM  Result Value Ref Range   Glucose-Capillary 82 70 - 99 mg/dL  CBC     Status: Abnormal   Collection Time: 12/08/17  4:41 AM  Result Value Ref Range   WBC 7.4 3.8 - 10.6 K/uL   RBC 2.96 (L) 4.40 - 5.90 MIL/uL   Hemoglobin 10.5 (L) 13.0 - 18.0 g/dL   HCT 40.929.6 (L) 81.140.0 - 91.452.0 %   MCV 100.1 (H) 80.0 - 100.0 fL   MCH 35.4 (H) 26.0 - 34.0 pg   MCHC 35.3 32.0 - 36.0 g/dL   RDW 78.213.5 95.611.5 - 21.314.5 %   Platelets 261 150 - 440 K/uL  Basic metabolic panel     Status: Abnormal   Collection Time: 12/08/17  4:41 AM  Result Value Ref Range   Sodium 133 (L) 135 - 145 mmol/L   Potassium 3.9 3.5 - 5.1 mmol/L   Chloride 99 98 - 111 mmol/L   CO2 26 22 - 32 mmol/L   Glucose, Bld 180 (H) 70 - 99 mg/dL   BUN 10 6 - 20 mg/dL   Creatinine, Ser 0.860.39 (L) 0.61 - 1.24 mg/dL   Calcium 7.8 (L) 8.9 - 10.3 mg/dL   GFR calc non Af Amer >60 >60 mL/min   GFR calc Af Amer >60 >60 mL/min   Anion gap 8 5 - 15  Magnesium     Status: Abnormal    Collection Time: 12/08/17  4:41 AM  Result Value Ref Range   Magnesium 1.6 (L) 1.7 - 2.4 mg/dL  Glucose, capillary     Status: Abnormal   Collection Time: 12/08/17  8:55 AM  Result Value Ref Range   Glucose-Capillary 190 (H) 70 - 99 mg/dL  Glucose, capillary     Status: Abnormal   Collection Time: 12/08/17 12:19 PM  Result Value Ref Range   Glucose-Capillary 171 (H) 70 - 99 mg/dL    No results found.  Assessment/Plan: 3 Days Post-Op   Active Problems:   Closed right hip fracture (HCC)   Pressure injury of skin   Being discharged to SNF today.  Follow up in 2 weeks.  Continue PT.  WBAT on right lower extremity.  Continue lovenox 30mg  SQ daily for DVT prophylaxis x 4 weeks.   Juanell FairlyKRASINSKI, Ayyub Krall , MD 12/08/2017, 1:15 PM

## 2017-12-08 NOTE — Discharge Instructions (Signed)
Follow-up with primary care physician at the facility in 2 to 3 days  follow-up with Dr. Martha ClanKrasinski in 2 weeks

## 2017-12-08 NOTE — Clinical Social Work Placement (Signed)
   CLINICAL SOCIAL WORK PLACEMENT  NOTE  Date:  12/08/2017  Patient Details  Name: Reginald StallJeffrey M Rabbani MRN: [id number] Date of Birth: 05/15/1966  Clinical Social Work is seeking post-discharge placement for this patient at the Skilled  Nursing Facility level of care (*CSW will initial, date and re-position this form in  chart as items are completed):  Yes   Patient/family provided with [facility] Clinical Social Work Department's list of facilities offering this level of care within the geographic area requested by the patient (or if unable, by the patient's family).  Yes   Patient/family informed of their freedom to choose among providers that offer the needed level of care, that participate in Medicare, Medicaid or managed care program needed by the patient, have an available bed and are willing to accept the patient.  Yes   Patient/family informed of [facility]'s ownership interest in [facility] and [facility], as well as of the fact that they are under no obligation to receive care at these facilities.  PASRR submitted to EDS on 12/04/17     PASRR number received on 12/07/17     Existing PASRR number confirmed on       FL2 transmitted to all facilities in geographic area requested by pt/family on 12/04/17     FL2 transmitted to all facilities within larger geographic area on       Patient informed that his/her managed care company has contracts with or will negotiate with certain facilities, including the following:        Yes   Patient/family informed of bed offers received.  [facility] at [facility]     Physician recommends and patient chooses bed at      Patient to be transferred to ([facility] on 12/08/17.  [facility]     Patient family notified on 12/08/17 of transfer.  Name of family member notified:  (DSS guardian Christena Deembonie White is aware of D/C today. )     PHYSICIAN       Additional Comment:     _______________________________________________ Nalayah Hitt, Darleen CrockerBailey M, LCSW 12/08/2017, 2:20 PM

## 2017-12-08 NOTE — Consult Note (Signed)
MEDICATION RELATED CONSULT NOTE   Pharmacy Consult for electrolytes Indication: hypokalemia  Allergies  Allergen Reactions  . Other     GLUTEN INTOLERANCE    Patient Measurements: Height: 4' 9.99" (147.3 cm) Weight: 98 lb (44.5 kg) IBW/kg (Calculated) : 45.38 Adjusted Body Weight:   Vital Signs: Temp: 98.6 F (37 C) (07/01 2336) Temp Source: Oral (07/01 2336) BP: 124/89 (07/01 2336) Pulse Rate: 108 (07/01 2336) Intake/Output from previous day: 07/01 0701 - 07/02 0700 In: 120 [P.O.:120] Out: 1500 [Urine:1500] Intake/Output from this shift: No intake/output data recorded.  Labs: Recent Labs    12/06/17 0522 12/07/17 0618 12/08/17 0441  WBC 7.5 10.4 7.4  HGB 11.4* 10.9* 10.5*  HCT 32.4* 30.1* 29.6*  PLT 212 242 261  CREATININE 0.48* 0.34* 0.39*  MG  --   --  1.6*   Estimated Creatinine Clearance: 66.4 mL/min (A) (by C-G formula based on SCr of 0.39 mg/dL (L)).    Assessment: Pt is a 52 year old male admitted with hip fracture and hypokalemia. Pt having watery diarrhea. K on admission was 2.5. Pt received 140 MEQ of KCL yesterday and another 20 MEQ this AM. K up to 3.2. Add on Mg was 1.8.  Goal of Therapy:  K=3.5-5  Plan:  K 3.9  Mag 1.6  Scr 0.39 Will order Magnesium sulfate 2 gram IV x 1. Recheck in the AM  Bari MantisKristin Jazlene Bares PharmD Clinical Pharmacist 12/08/2017

## 2017-12-08 NOTE — Progress Notes (Signed)
Pt discharged to [facility] without incident per MD order via stretcher by nonemergency transport. D/C envelope accompanied pt. Pt pain controlled on discharge. No change in pt from AM assessment. Rept called to Belinda RN at [facility] upon discharge.

## 2017-12-08 NOTE — Discharge Summary (Signed)
[facility]   PATIENT NAME: Reginald Hall    MR#:  [id number]  DATE OF BIRTH:  06/14/1965  DATE OF ADMISSION:  12/04/2017 ADMITTING PHYSICIAN: Milagros LollSrikar Sudini, MD  DATE OF DISCHARGE:  12/08/17  PRIMARY CARE PHYSICIAN: Derwood KaplanEason, Ernest B, MD    ADMISSION DIAGNOSIS:  Hypokalemia [E87.6] Hyponatremia [E87.1] Closed right hip fracture, initial encounter (HCC) [S72.001A]  DISCHARGE DIAGNOSIS:  Active Problems:   Closed right hip fracture (HCC)   Pressure injury of skin   SECONDARY DIAGNOSIS:   Past Medical History:  Diagnosis Date  . Diabetes mellitus without complication [facility](HCC)    Patient takes Metformin  . Hypertension   . Hyponatremia   . Mental retardation     HOSPITAL COURSE:   HPI  Reginald Hall  is a 52 y.o. male with a known history of hypertension, diabetes, cognitive impairment presents from a group home after having a mechanical fall.  Patient started complaining of right hip pain and was brought to the emergency room.  He has right intertrochanteric hip fracture and is being admitted to the hospital for surgery and further management. Patient due to his cognitive impairment is unable to contribute to history.  Old records reviewed.  Discussed with ED staff.  *Intertrochanteric right femur fracture postop day #2 Postop care by Dr. Martha ClanKrasinski Follow up in 2 weeks.  Continue PT.  WBAT on right lower extremity.  Continue lovenox 30mg  SQ daily for DVT prophylaxis x 4 weeks. Pain medications added.   *Hyponatremia from dehydration from diarrhea  hydrated with IV fluids and sodium chloride tablets added to the regimen for 2 days Improving Sodium was normal in October 2017 at 139  *Watery diarrhea   stool for C. difficile toxin orderd patient is on enteric precautions Diarrhea resolved per nurse  *Diabetes mellitus. Continue home medications. Sliding scale insulin.   *Hypertension. Continue medications. Blood  pressure is soft Cozaar on hold  Hypokalemia resolved  Hypomagnesemia repleted with magnesium  * urinary retention- foley cath placed by urology dr.Bell . D/ced today , voiding ok   Disposition  -SNF today  PASSAR approved       DISCHARGE CONDITIONS:   Stable   CONSULTS OBTAINED:  Treatment Team:  Juanell FairlyKrasinski, Kevin, MD   PROCEDURES right hip surgery  DRUG ALLERGIES:   Allergies  Allergen Reactions  . Other     GLUTEN INTOLERANCE    DISCHARGE MEDICATIONS:   Allergies as of 12/08/2017      Reactions   Other    GLUTEN INTOLERANCE      Medication List    STOP taking these medications   losartan 50 MG tablet Commonly known as:  COZAAR     TAKE these medications   acetaminophen 325 MG tablet Commonly known as:  TYLENOL Take 2 tablets (650 mg total) by mouth every 6 (six) hours as needed for mild pain (or Fever >/= 101).   cholecalciferol 1000 units tablet Commonly known as:  VITAMIN D Take 1,000 Units by mouth daily.   docusate sodium 100 MG capsule Commonly known as:  COLACE Take 100 mg by mouth 2 (two) times daily.   enoxaparin 30 MG/0.3ML injection Commonly known as:  LOVENOX Inject 0.3 mLs (30 mg total) into the skin daily. Start taking on:  12/09/2017   eucerin cream APPLY TOPICALLY AS NEEDED FOR DRY SKIN.   ferrous sulfate 325 (65 FE) MG tablet Take 325 mg by mouth 2 (two) times daily with a meal.  glimepiride 4 MG tablet Commonly known as:  AMARYL Take 4 mg by mouth daily with breakfast.   loratadine 10 MG tablet Commonly known as:  CLARITIN Take 10 mg by mouth daily.   metFORMIN 1000 MG tablet Commonly known as:  GLUCOPHAGE Take 1,000 mg by mouth 2 (two) times daily.   methocarbamol 500 MG tablet Commonly known as:  ROBAXIN Take 1 tablet (500 mg total) by mouth every 6 (six) hours as needed for muscle spasms.   pantoprazole 40 MG tablet Commonly known as:  PROTONIX Take 40 mg by mouth 2 (two) times daily.   polyethylene  glycol packet Commonly known as:  MIRALAX / GLYCOLAX Take 17 g by mouth daily as needed for mild constipation.   sodium chloride 1 g tablet Take 1 tablet (1 g total) by mouth 2 (two) times daily with a meal.   traMADol 50 MG tablet Commonly known as:  ULTRAM Take 1 tablet (50 mg total) by mouth every 8 (eight) hours as needed.   vitamin B-12 1000 MCG tablet Commonly known as:  CYANOCOBALAMIN Take 1,000 mcg by mouth daily.   vitamin E 400 UNIT capsule Take 400 Units by mouth daily.            Durable Medical Equipment  (From admission, onward)        Start     Ordered   12/07/17 1501  For home use only DME Walker rolling  Once    Comments:  Rolling walker with 5 inch wheels youth size  Question:  Patient needs a walker to treat with the following condition  Answer:  Hip pain   12/07/17 1501       DISCHARGE INSTRUCTIONS:   Follow-up with primary care physician at the facility in 2 to 3 days  follow-up with Dr. Martha Clan in 2 weeks   DIET:  Diabetic diet  DISCHARGE CONDITION:  Fair  ACTIVITY:  Activity as tolerated per ortho  OXYGEN:  Home Oxygen: No.   Oxygen Delivery: room air  DISCHARGE LOCATION:  nursing home   If you experience worsening of your admission symptoms, develop shortness of breath, life threatening emergency, suicidal or homicidal thoughts you must seek medical attention immediately by calling 911 or calling your MD immediately  if symptoms less severe.  You Must read complete instructions/literature along with all the possible adverse reactions/side effects for all the Medicines you take and that have been prescribed to you. Take any new Medicines after you have completely understood and accpet all the possible adverse reactions/side effects.   Please note  You were cared for by a hospitalist during your hospital stay. If you have any questions about your discharge medications or the care you received while you were in the hospital  after you are discharged, you can call the unit and asked to speak with the hospitalist on call if the hospitalist that took care of you is not available. Once you are discharged, your primary care physician will handle any further medical issues. Please note that NO REFILLS for any discharge medications will be authorized once you are discharged, as it is imperative that you return to your primary care physician (or establish a relationship with a primary care physician if you do not have one) for your aftercare needs so that they can reassess your need for medications and monitor your lab values.     Today  Chief Complaint  Patient presents with  . Fall   Patient is doing fine.  No  complaints.  ROS:  CONSTITUTIONAL: Denies fevers, chills. Denies any fatigue, weakness.  EYES: Denies blurry vision, double vision, eye pain. EARS, NOSE, THROAT: Denies tinnitus, ear pain, hearing loss. RESPIRATORY: Denies cough, wheeze, shortness of breath.  CARDIOVASCULAR: Denies chest pain, palpitations, edema.  GASTROINTESTINAL: Denies nausea, vomiting, diarrhea, abdominal pain. Denies bright red blood per rectum. GENITOURINARY: Denies dysuria, hematuria. ENDOCRINE: Denies nocturia or thyroid problems. HEMATOLOGIC AND LYMPHATIC: Denies easy bruising or bleeding. SKIN: Denies rash or lesion. MUSCULOSKELETAL: Right hip pain denies pain in neck, back, shoulder, knees, hips or arthritic symptoms.  NEUROLOGIC: Denies paralysis, paresthesias.  PSYCHIATRIC: Denies anxiety or depressive symptoms.   VITAL SIGNS:  Blood pressure 122/85, pulse 78, temperature 98 F (36.7 C), resp. rate 18, height 4' 9.99" (1.473 m), weight 44.5 kg (98 lb), SpO2 100 %.  I/O:    Intake/Output Summary (Last 24 hours) at 12/08/2017 1329 Last data filed at 12/08/2017 1219 Gross per 24 hour  Intake 120 ml  Output 650 ml  Net -530 ml    PHYSICAL EXAMINATION:  GENERAL:  52 y.o.-year-old patient lying in the bed with no acute  distress.  EYES: Pupils equal, round, reactive to light and accommodation. No scleral icterus. Extraocular muscles intact.  HEENT: Head atraumatic, normocephalic. Oropharynx and nasopharynx clear.  NECK:  Supple, no jugular venous distention. No thyroid enlargement, no tenderness.  LUNGS: Normal breath sounds bilaterally, no wheezing, rales,rhonchi or crepitation. No use of accessory muscles of respiration.  CARDIOVASCULAR: S1, S2 normal. No murmurs, rubs, or gallops.  ABDOMEN: Soft, non-tender, non-distended. Bowel sounds present. No organomegaly or mass.  EXTREMITIES: No pedal edema, cyanosis, or clubbing.  NEUROLOGIC: Cranial nerves II through XII are intact.  Right hip with clean honeycomb dressing sensation intact. Gait not checked.  PSYCHIATRIC: The patient is alert and oriented x 3.  SKIN: No obvious rash, lesion, or ulcer.   DATA REVIEW:   CBC Recent Labs  Lab 12/08/17 0441  WBC 7.4  HGB 10.5*  HCT 29.6*  PLT 261    Chemistries  Recent Labs  Lab 12/08/17 0441  NA 133*  K 3.9  CL 99  CO2 26  GLUCOSE 180*  BUN 10  CREATININE 0.39*  CALCIUM 7.8*  MG 1.6*    Cardiac Enzymes No results for input(s): TROPONINI in the last 168 hours.  Microbiology Results  Results for orders placed or performed during the hospital encounter of 12/04/17  MRSA PCR Screening     Status: Abnormal   Collection Time: 12/04/17  2:01 PM  Result Value Ref Range Status   MRSA by PCR POSITIVE (A) NEGATIVE Final    Comment:        The GeneXpert MRSA Assay (FDA approved for NASAL specimens only), is one component of a comprehensive MRSA colonization surveillance program. It is not intended to diagnose MRSA infection nor to guide or monitor treatment for MRSA infections. RESULT CALLED TO, READ BACK BY AND VERIFIED WITH: CORY HUDSON 12/04/17 AT 1538 BY HS Performed at [facility], [address] [place], [place] [address]   C difficile quick scan w PCR reflex     Status:  None   Collection Time: 12/05/17  6:04 PM  Result Value Ref Range Status   C Diff antigen NEGATIVE NEGATIVE Final   C Diff toxin NEGATIVE NEGATIVE Final   C Diff interpretation No C. difficile detected.  Final    Comment: Performed at [facility], [address] [place], [place] [address]    RADIOLOGY:  Dg Hip Port Unilat With Pelvis 1v Right  Result Date: 12/05/2017 CLINICAL DATA:  Status post right hip fracture fixation. EXAM: DG HIP (WITH OR WITHOUT PELVIS) 1V PORT RIGHT COMPARISON:  12/04/2017 FINDINGS: There has been interval repair of intertrochanteric right femoral fracture with intramedullary nail and screw placement. The alignment is anatomic. Expected postsurgical changes noted. IMPRESSION: Status post open reduction internal fixation of right intertrochanteric fracture without evidence of immediate complications. Electronically Signed   By: Ted Mcalpine M.D.   On: 12/05/2017 12:44   Dg Hip Operative Unilat W Or W/o Pelvis Right  Result Date: 12/05/2017 CLINICAL DATA:  Intraoperative images from open reduction internal fixation of right intertrochanteric femoral fracture. EXAM: OPERATIVE RIGHT HIP (WITH PELVIS IF PERFORMED) TECHNIQUE: Fluoroscopic spot image(s) were submitted for interpretation post-operatively. COMPARISON:  12/04/2017 FINDINGS: Multiple fluoroscopic intraoperative images from intramedullary pin and screw fixation of right intertrochanteric femoral fracture demonstrate normal alignment of the orthopedic hardware. No new fractures are seen. Fluoroscopy time is recorded as 52 seconds. IMPRESSION: Fluoroscopic images from open reduction internal fixation of right intertrochanteric femoral fracture without evidence of immediate complications. Electronically Signed   By: Ted Mcalpine M.D.   On: 12/05/2017 13:05    EKG:   Orders placed or performed during the hospital encounter of 12/04/17  . ED EKG  . ED EKG  . EKG 12-Lead  . EKG 12-Lead  .  EKG  . EKG 12-Lead  . EKG 12-Lead      Management plans discussed with the patient, family and they are in agreement.  CODE STATUS:  Code Status History    Date Active Date Inactive Code Status Order ID Comments User Context   12/04/2017 2205 12/05/2017 1249 Full Code [id number]  Juanell Fairly, MD Inpatient   12/04/2017 1139 12/04/2017 2205 Full Code [id number]  Milagros Loll, MD ED      TOTAL TIME TAKING CARE OF THIS PATIENT: 41  minutes.   Note: This dictation was prepared with Dragon dictation along with smaller phrase technology. Any transcriptional errors that result from this process are unintentional.   @M[facility] @  on 12/08/2017 at 1:29 PM  Between 7am to 6pm - Pager - [phone]  After 6pm go to [email or web address] - password [email or web address] [facility]  [facility]  Office  [phone]  CC: Primary care physician; Derwood Kaplan, MD

## 2017-12-08 NOTE — Progress Notes (Signed)
Physical Therapy Treatment Patient Details Name: Reginald StallJeffrey M Haywood MRN: [id number] DOB: 07/07/1965 Today's Date: 12/08/2017    History of Present Illness Pt sufferred R femur fracture and is POD#1 s/p intertrochanteric fixation. No reports post-op complications. PMH includes MR, DM, and HTN.    PT Comments    Pt in bed, ready to get up.  To edge of bed with min a this am.  Once sitting, able to sit without support and supervision.  Stood to 3M CompanyW with min assist.  Poor hand placements despite verbal and tactile cues.  Pediatric RW was used with good fit this session.  He transferred to recliner at bedside with min assist and short choppy steps but overall did well.  Participated in exercises as described below.  Pt declined further gait and exercise stating "That's enough for now."  Remained in recliner for breakfast.   Follow Up Recommendations  SNF     Equipment Recommendations  Rolling walker with 5" wheels;Other (comment)    Recommendations for Other Services       Precautions / Restrictions Precautions Precautions: Fall Restrictions Weight Bearing Restrictions: Yes RLE Weight Bearing: Weight bearing as tolerated    Mobility  Bed Mobility Overal bed mobility: Needs Assistance Bed Mobility: Supine to Sit     Supine to sit: Min assist        Transfers Overall transfer level: Needs assistance Equipment used: Rolling walker (2 wheeled) Transfers: Sit to/from Stand Sit to Stand: Min assist         General transfer comment: verbal and tactile cues for hand placement but continues to pull up on walker  Ambulation/Gait Ambulation/Gait assistance: Min assist Gait Distance (Feet): 4 Feet Assistive device: Rolling walker (2 wheeled)   Gait velocity: Below functional limits for household mobility   General Gait Details: Pt mildly unsteady requiring assist. Pt demonstrating improved wt shift to the R with LLE held in a more flexed position throughout gait. Short choppy  steps.    Stairs             Wheelchair Mobility    Modified Rankin (Stroke Patients Only)       Balance Overall balance assessment: Needs assistance Sitting-balance support: Bilateral upper extremity supported;Feet supported Sitting balance-Leahy Scale: Good     Standing balance support: Bilateral upper extremity supported Standing balance-Leahy Scale: Poor Standing balance comment: Requires UE support on walker in standing and mina+1 to stabilize                            Cognition Arousal/Alertness: Awake/alert Behavior During Therapy: WFL for tasks assessed/performed Overall Cognitive Status: History of cognitive impairments - at baseline                                        Exercises Other Exercises Other Exercises: marching in place x 10 BLe, seated AROM for LAQ, marches and ab/add x 10    General Comments        Pertinent Vitals/Pain Pain Assessment: No/denies pain Pain Location: RLE, mild grimacing with mobility, but no significant overt signs of pain/distress. Smiling throughout session Pain Intervention(s): Monitored during session    Home Living                      Prior Function  PT Goals (current goals can now be found in the care plan section) Progress towards PT goals: Progressing toward goals    Frequency           PT Plan Current plan remains appropriate    Co-evaluation              AM-PAC PT "6 Clicks" Daily Activity  Outcome Measure  Difficulty turning over in bed (including adjusting bedclothes, sheets and blankets)?: A Little Difficulty moving from lying on back to sitting on the side of the bed? : A Little Difficulty sitting down on and standing up from a chair with arms (e.g., wheelchair, bedside commode, etc,.)?: Unable Help needed moving to and from a bed to chair (including a wheelchair)?: A Little Help needed walking in hospital room?: A Lot Help needed  climbing 3-5 steps with a railing? : A Lot 6 Click Score: 14    End of Session Equipment Utilized During Treatment: Gait belt Activity Tolerance: Patient tolerated treatment well Patient left: in chair;with call bell/phone within reach;with chair alarm set         Time: 9604-5409 PT Time Calculation (min) (ACUTE ONLY): 17 min  Charges:  $Therapeutic Exercise: 8-22 mins                    G Codes:       Danielle Dess, PTA 12/08/17, 9:03 AM

## 2017-12-08 NOTE — Progress Notes (Signed)
Patient is medically stable for D/C to [facility] today. Per Tammy admissions coordinator at [facility] patient can come today to room 505. RN will call report and arrange EMS for transport. Clinical Child psychotherapistocial Worker (CSW) sent D/C orders to [facility] via [facility]. Patient is aware of above. CSW contacted DSS guardian Christena Deembonie White and made her aware of above. CSW sent D/C summary to guardian as requested. Medicare bundle case manager is aware of above. Please reconsult if future social work needs arise. CSW signing off.   Baker Hughes IncorporatedBailey Janiene Aarons, LCSW [phone]

## 2017-12-18 ENCOUNTER — Ambulatory Visit: Admit: 2017-12-18 | Discharge: 2017-12-18 | Payer: Commercial Managed Care - PPO

## 2017-12-18 DIAGNOSIS — E785 Hyperlipidemia, unspecified: Secondary | ICD-10-CM

## 2017-12-18 DIAGNOSIS — R7303 Prediabetes: Secondary | ICD-10-CM

## 2017-12-18 DIAGNOSIS — G8929 Other chronic pain: Secondary | ICD-10-CM

## 2017-12-18 DIAGNOSIS — F329 Major depressive disorder, single episode, unspecified: Secondary | ICD-10-CM

## 2017-12-18 DIAGNOSIS — M25562 Pain in left knee: Secondary | ICD-10-CM

## 2017-12-18 DIAGNOSIS — R002 Palpitations: Secondary | ICD-10-CM

## 2017-12-18 DIAGNOSIS — Z0000 Encounter for general adult medical examination without abnormal findings: Secondary | ICD-10-CM

## 2017-12-18 DIAGNOSIS — I10 Essential (primary) hypertension: Secondary | ICD-10-CM

## 2017-12-18 MED ORDER — HYDROCHLOROTHIAZIDE 12.5 MG CAPSULE
12.5 | ORAL | Status: AC
Start: 2017-12-18 — End: ?

## 2017-12-18 MED ORDER — BUPROPION HCL XL 300 MG 24 HR TABLET, EXTENDED RELEASE
300 | ORAL | Status: DC
Start: 2017-12-18 — End: 2017-12-18

## 2017-12-18 MED ORDER — BUPROPION HCL XL 300 MG 24 HR TABLET, EXTENDED RELEASE
300 | ORAL_TABLET | Freq: Every day | ORAL | 3 refills | Status: AC
Start: 2017-12-18 — End: ?

## 2017-12-18 MED ORDER — LOSARTAN 25 MG TABLET
25 | ORAL | Status: DC
Start: 2017-12-18 — End: 2018-02-26

## 2017-12-18 MED ORDER — ATENOLOL 2MG/ML ORAL SUSP
2 | Freq: Every day | ORAL | 0 refills | Status: DC
Start: 2017-12-18 — End: 2018-03-18

## 2017-12-18 NOTE — Assessment & Plan Note (Signed)
Started in mid 2000's, correlated with PVC's on holter (see Care Everywhere). On atenolol 12.5 mg, controls sx, however has some light headedness    -Decrease atenolol to 6.25 mg (prescribed solution)  -F/u in 1 month

## 2017-12-18 NOTE — Assessment & Plan Note (Signed)
Symptoms well controlled, PHQ2 today. Patient interested in potentially weaning in the future    -Continue wellbutrin 300 mg qd, refilled today  -Discuss weaning at next visit

## 2017-12-18 NOTE — Assessment & Plan Note (Signed)
Colorectal cancer: FIT test ordered  HCV: neg 2007 per care everywhere  HIV  (once age 54-64 or annually if high risk)  Lipids (35 and older avg q5 yr): ordered today  Hb A1c (sustained BP > 135/80):ordered today  Tobacco (annual): former smoker, quit in 2003. Previously 1/2 ppd x 17 years  Alcohol (annual):  2-3 glasses of wine/day  Depression (variable): see separate problem  Td/TdaP (TdaP once, then Td q10 yr): last 2008, needs  Pneumovax  Hep B: series done in 2007 with [facility]  PSA: 0.9 2017

## 2017-12-18 NOTE — Assessment & Plan Note (Signed)
Childhood injury to left knee, now with chronic pain and OA on imaging with likely meniscal tear. Pain has improved, however patient would like to make sure he can remain active    -PT referral

## 2017-12-18 NOTE — Patient Instructions (Addendum)
Dear Chris Roach,    Please have your labs done when you can.    For your palpitations: Please cut your atenolol in half to 6.0 mg, we will be switching to a solution. If you start having more palpitations, please let us know.   -Your holter monitor showed that you had PVC's   Stopping atenolol suddenly can cause rebound tachycardia    Healthcare maintenance:   -For colon cancer: I ordered a stool sample  -For prostate cancer: your PSA was normal in 2017, we will reorder in 2020    [name] has two acute care clinics that make same day appointments. One is at [place] and their phone number is [phone]. [place] [address] and their phone number is [phone].

## 2017-12-18 NOTE — Assessment & Plan Note (Signed)
SBP 138 today, in this healthy person, would favor a goal of <130, however on first visit, will not change meds. Unclear why patient is on 2 BP meds with neither being maxed    -Continue losartan 25 mg  -Continue HCTZ 12.5 mg  -F/u in 1 month, consider increasing losartan and d/c'ing HCTZ  -BMP ordered

## 2017-12-18 NOTE — H&P (Signed)
Subjective    Chris Roach is a 52 y.o. male who presents with concerns including   Chief Complaint   Patient presents with    New Patient Evaluation       History of present illness  HTN    Pre-DM: worried about developing diabetes based on family history  Weight is pretty stable  Diet recall: reports potentially eating too much meat, has cravings for bad food.   -No sugary beverages at all  -Has sweet cravings, depends on stress    Back pain: l5/s1 foraminal narrowing following with ortho, considering surgery  -Started about 8-10 years ago (mild at that tmie), worsened about 4 years ago  -Did PT about 2 years ago without much help     Left knee: meniscal tear (no repari), arthritis. Had injury as child that caused this  -Lately pain has improved  -Can't play tennis quite as well as in past    Irregular heart beat: started in 2005, did a holter monitor which was normal. Was put on atenolol, which stopped the symptoms. Pt interested in coming off, bc he has read that there is a correlation between atenolol and depression    Depression/anxiety:  Well controlled, on well butrin    HCM  -CRC: has done FIT in the past, happy to continue with that  -HIV/Hep C: thinks he did at [facility]  -Tobacco: 1/2 ppd x 17 years, quit in 2003\  -EtOH: drinks wine, 3 bottles/week  -Prostate Cancer screening: 0.9 in 2017    Patient has no known allergies.    Medications the patient states to be taking prior to today's encounter.   Medication Sig    buPROPion (WELLBUTRIN XL) 300 mg 24 hr tablet Take 1 tablet (300 mg total) by mouth Daily.    hydroCHLOROthiazide (MICROZIDE) 12.5 mg capsule Take 12.5 mg by mouth Daily.    losartan (COZAAR) 25 mg tablet Take 25 mg by mouth Daily.    [DISCONTINUED] atenolol (TENORMIN) 25 mg tablet Take 12.5 mg by mouth Daily.     [DISCONTINUED] buPROPion (WELLBUTRIN XL) 300 mg 24 hr tablet Take 300 mg by mouth Daily.    [DISCONTINUED] HYDROcodone-acetaminophen (NORCO) 5-325 mg tablet Take 1 tablet by  mouth every 4 (four) hours as needed for Pain.       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    Objective      Vitals:    12/18/17 0811   BP: 138/84   BP Location: Right upper arm   Patient Position: Sitting   Cuff Size: Adult   Pulse: 58   SpO2: 100%   Weight: 94.6 kg (208 lb 9.6 oz)   Height: 188 cm (6\' 2" )        Physical Exam   General: awake, alert, no acute distress  HEENT: atraumatic, anicteric sclera  CV: RRR, normal s1 and s2. No murmur  Pulm: speaking in full sentences comfortably without appearing short of breath,  clear to auscultation bilaterally throughout all lung fields  Neuro: stable gait, moving 4 extremities freely  Psych: normal mood and affect  PHQ-9 Total Score: 2    I personally reviewed and interpreted the following test(s):  Labs from 2017:  PSA: 0.9  Vitamin D: 30/6  Hemoglobin a1c 5.5  BMP: wnl    From Care Everywhere  HOLTER MONITORING REPORT  Date of Holter Monitoring: 09/15/2005  RESULTS:  Rhythm: sinus rhythm.  Minimum HR: 45  Average HR: 71  Maximum HR: 169  Longest R-R Interval: 1.4 seconds.     PACs: rare  Supraventricular Runs: none  PVCs: moderately frequent, 1438 beats, 1.4% total heart beats  Couplets: none  Triplets: none  Ventricular runs: none  Pauses: none greater than 2.5 seconds  Symptoms: Patient reported symptoms of: multiple episodes"skipped beat" which usually correlated with the presence ofsingle PVCs. One episode of shortness of breath did not correlate with any change in rhythm.    2009 L Knee MRI: ** IMPRESSION **:   Tear to the inferior articular surface of the posterior horn  of   the medial meniscus.     Assessment and Plan   Mr. Chris Roach is a 52 yo M with PMH of HTN and depression presenting to establish care    Problem based assessment and plan:  Palpitations  Started in mid 2000's, correlated with PVC's on holter (see Care Everywhere). On atenolol 12.5 mg, controls sx, however has some light headedness    -Decrease atenolol to 6.25 mg (prescribed solution)  -F/u in 1 month    Hypertension  SBP 138 today, in this healthy person, would favor a goal of <130, however on first visit, will not change meds. Unclear why patient is on 2 BP meds with neither being maxed    -Continue losartan 25 mg  -Continue HCTZ 12.5 mg  -F/u in 1 month, consider increasing losartan and d/c'ing HCTZ  -BMP ordered    Healthcare maintenance  Colorectal cancer: FIT test ordered  HCV: neg 2007 per care everywhere  HIV  (once age 54-64 or annually if high risk)  Lipids (35 and older avg q5 yr): ordered today  Hb A1c (sustained BP > 135/80):ordered today  Tobacco (annual): former smoker, quit in 2003. Previously 1/2 ppd x 17 years  Alcohol (annual):  2-3 glasses of wine/day  Depression (variable): see separate problem  Td/TdaP (TdaP once, then Td q10 yr): last 2008, needs  Pneumovax  Hep B: series done in 2007 with [facility]  PSA: 0.9 2017    Prediabetes  A1c of 5.8 per patient at last check. Weight is stable, however he does crave sweets    -A1c ordered  -nutrition counseling pending results    Depression  Symptoms well controlled, PHQ2 today. Patient interested in potentially weaning in the future    -Continue wellbutrin 300 mg qd, refilled today  -Discuss weaning at next visit    Chronic pain of left knee  Childhood injury to left knee, now with chronic pain and OA on imaging with likely meniscal tear. Pain has improved, however patient would like to make sure he can remain active    -PT referral        I spent a total of 60 minutes face-to-face with the patient and >50% of that time was spent counseling regarding the  symptoms, treatment plan, risks and/or therapeutic options for the diagnoses above.

## 2017-12-18 NOTE — Assessment & Plan Note (Signed)
A1c of 5.8 per patient at last check. Weight is stable, however he does crave sweets    -A1c ordered  -nutrition counseling pending results

## 2018-01-26 ENCOUNTER — Encounter: Admit: 2018-01-26 | Discharge: 2018-01-27 | Payer: Commercial Managed Care - PPO

## 2018-01-26 DIAGNOSIS — Z0000 Encounter for general adult medical examination without abnormal findings: Secondary | ICD-10-CM

## 2018-01-27 LAB — FECAL OCCULT BLOOD TEST: Fecal Occult Blood Test: NEGATIVE

## 2018-02-26 MED ORDER — LOSARTAN 25 MG TABLET
25 | ORAL_TABLET | Freq: Every day | ORAL | 3 refills | Status: AC
Start: 2018-02-26 — End: ?

## 2018-03-22 MED ORDER — ATENOLOL 2MG/ML ORAL SUSP
2 | Freq: Every day | ORAL | 0 refills | Status: AC
Start: 2018-03-22 — End: ?

## 2018-03-22 NOTE — Telephone Encounter (Signed)
Pt calling for the following reason:    Returning call back, agree's to change to pill form, states that the solution was ordered because pill form wasn't available   States he's ok to swallow pills and breaks them in half if needed  Gave verbal approval to leave a confidential message and placed  "Patient gives consent to leave confidential voicemail on patient's primary contact.  Valid for one year.  Expires: 03/23/2019" in pt snap shot  Best number to reach caller at: SAME  Patient's home phone number: [phone]       OTT Reason for Call: Return call - clinical staff

## 2018-03-22 NOTE — Telephone Encounter (Signed)
Covering for Gershon Crane, MD    Spoke with pharmacist at [facility] [place].  Gave verbal order of atenolol 25mg  1/4 tab daily #90 day supply with 1 RF.  Pharmacist will relay message to pt and let us know if he has problem cutting the pill into 1/4

## 2018-03-22 NOTE — Telephone Encounter (Signed)
LM for pt to call back to OTT.    OTT  When pt calls back tell him that his Pharmacy will have difficulty getting the atenolol solution.  Ask him if he is able to swallow pills instead.  We can change to atenolol pills?  Also ask him if in the future we can leave confidential message on his preferred number.

## 2018-03-25 NOTE — Telephone Encounter (Signed)
Joy from [facility] pharmacy calling for the following reason:    To inform provider pharmacy has received atenolol suspension.   Wants to know if ok to dispense suspension as initially ordered and disregard rx for atenolol tabs (as these were only ordered until suspension was available).   Confirmed OK to dispense solution.   No additional questions. No f/up needed  Best number to reach caller at: same  Patient's home phone number: [phone]       OTT Resolution: Rx refill call from pharmacy

## 2018-06-10 ENCOUNTER — Ambulatory Visit
Admission: RE | Admit: 2018-06-10 | Discharge: 2018-06-10 | Disposition: A | Discharge status: Home / Self Care | Payer: Medicare Other | Source: Ambulatory Visit | Attending: Orthopedic Surgery | Admitting: Orthopedic Surgery

## 2018-06-10 ENCOUNTER — Other Ambulatory Visit: Payer: Self-pay | Admitting: Orthopedic Surgery

## 2018-06-10 DIAGNOSIS — R2241 Localized swelling, mass and lump, right lower limb: Secondary | ICD-10-CM | POA: Diagnosis not present

## 2018-08-26 NOTE — Telephone Encounter (Signed)
Last appointment: 12/18/2017 Gershon Crane, MD  Next appointment: Visit date not found    Last  BP:  BP Readings from Last 3 Encounters:   12/18/17 138/84   01/18/13 141/82   01/10/13 140/85       Last Labs:    No results found for: CREAT, NA, K, A1C, CHOL, LDL, HDL, TSH, HGB, GFRC, GFRAA, POCTHGBA1C    If patient has not been seen in clinic for 12 months or more I have routed encounter to the administrative team to book a follow-up appointment.

## 2018-08-27 MED ORDER — ATENOLOL 25 MG TABLET
25 | ORAL_TABLET | Freq: Every day | ORAL | 3 refills | Status: AC
Start: 2018-08-27 — End: ?

## 2018-08-27 MED ORDER — ATENOLOL 25 MG TABLET
25 | ORAL | Status: DC
Start: 2018-08-27 — End: 2018-08-27

## 2018-08-27 NOTE — Telephone Encounter (Signed)
Can you let patient know the prescribing system wouldn't let me put in for 6.25mg . The smallest dose was 12.5 mg, so I did that. However, he should stick with 6.25 mg.    Thanks,    Lafonda Mosses

## 2018-08-30 NOTE — Telephone Encounter (Signed)
Called pt and left vm msg relaying Dr. Levin Bacon msg and instructions.  Advised pt to call clinic if he has any questions.

## 2019-03-11 IMAGING — XA DG HIP (WITH PELVIS) OPERATIVE*R*
7 series · 7 of 7 positions shown · non-contrast
Comparison: 12/04/2017

CLINICAL DATA: Intraoperative images from open reduction internal
fixation of right intertrochanteric femoral fracture.

EXAM:
OPERATIVE RIGHT HIP (WITH PELVIS IF PERFORMED)
TECHNIQUE: Fluoroscopic spot image(s) were submitted for interpretation
post-operatively.

[Series 1: cont. · 1 of 1 slices shown (1 of 7)]
[im 1/1]
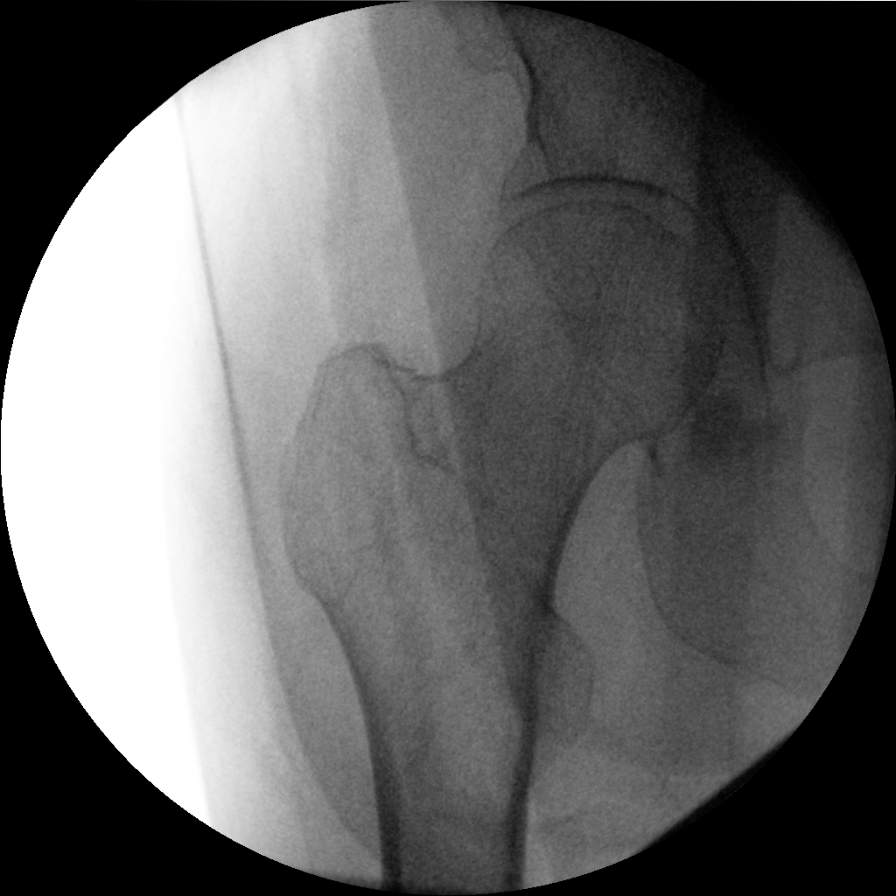

[Series 2: cont. · 1 of 1 slices shown (2 of 7)]
[im 1/1]
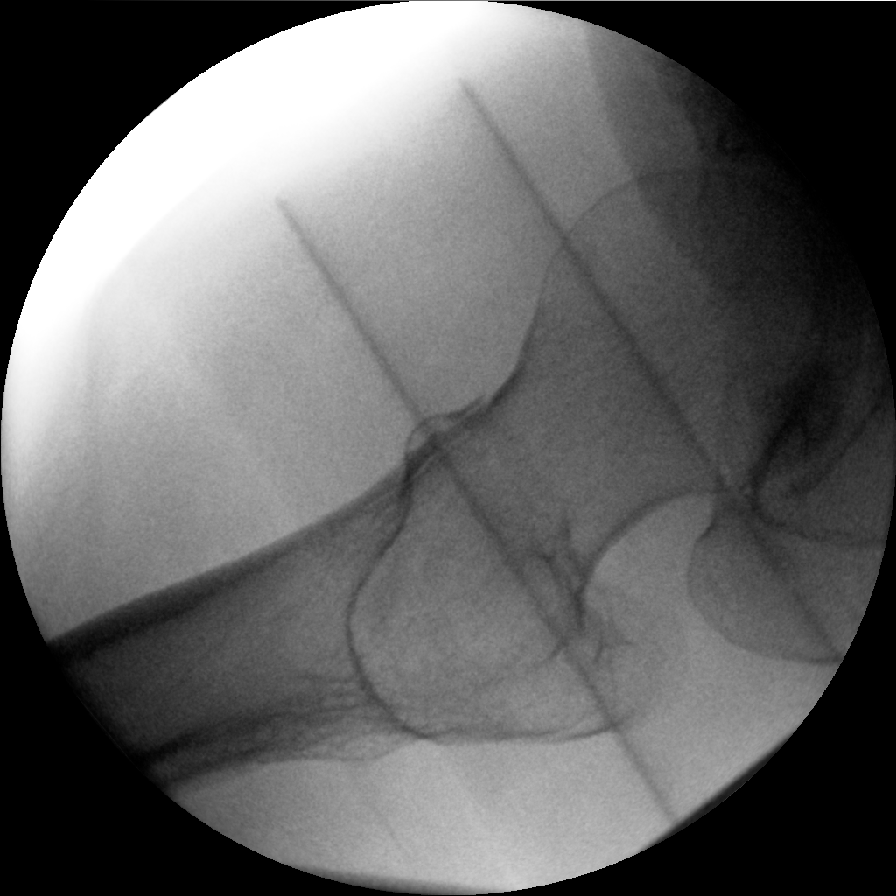

[Series 15: cont. · 1 of 1 slices shown (3 of 7)]
[im 1/1]
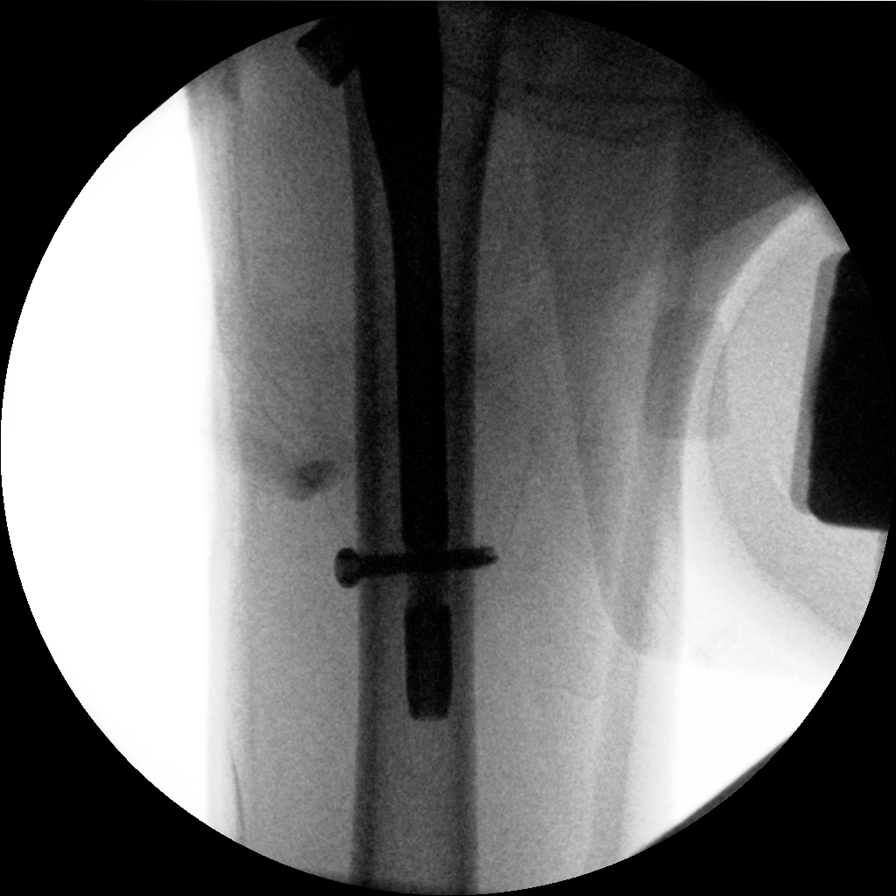

[Series 16: cont. · 1 of 1 slices shown (4 of 7)]
[im 1/1]
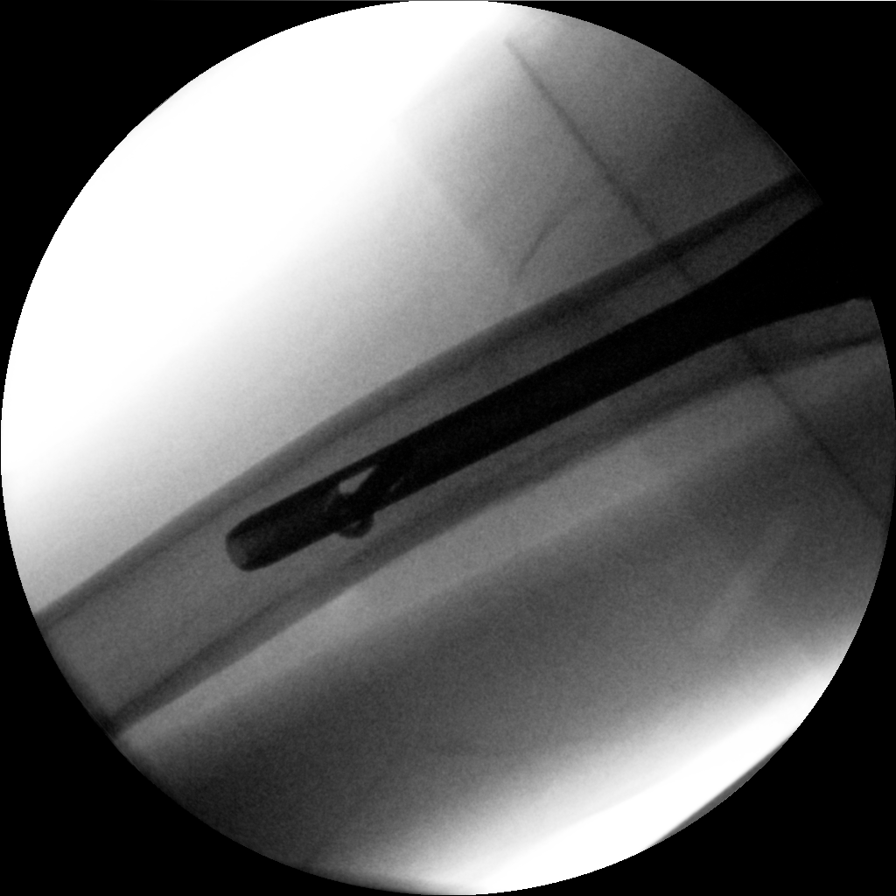

[Series 17: cont. · 1 of 1 slices shown (5 of 7)]
[im 1/1]
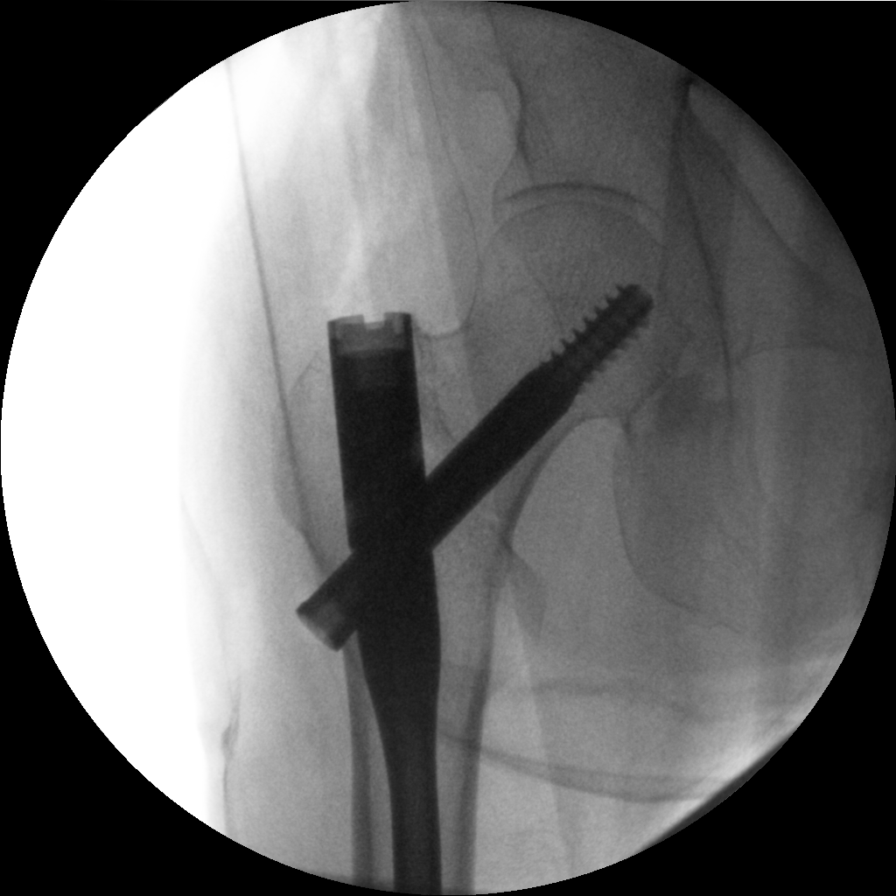

[Series 18: cont. · 1 of 1 slices shown (6 of 7)]
[im 1/1]
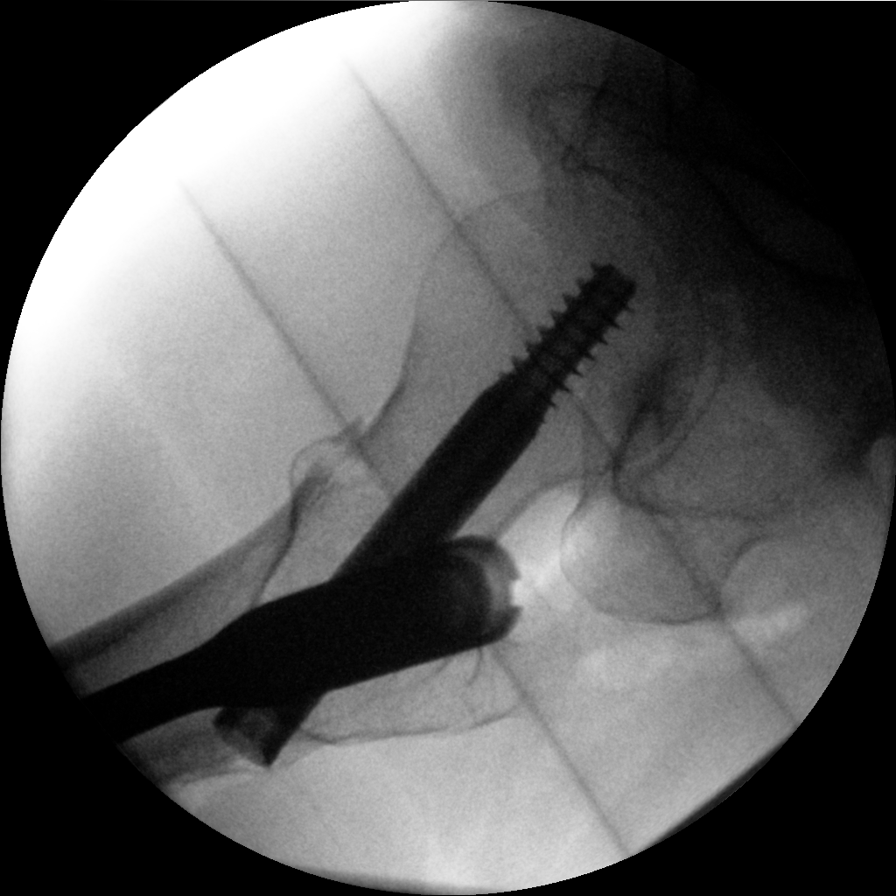

[Series 19: cont. · 1 of 1 slices shown (7 of 7)]
[im 1/1]
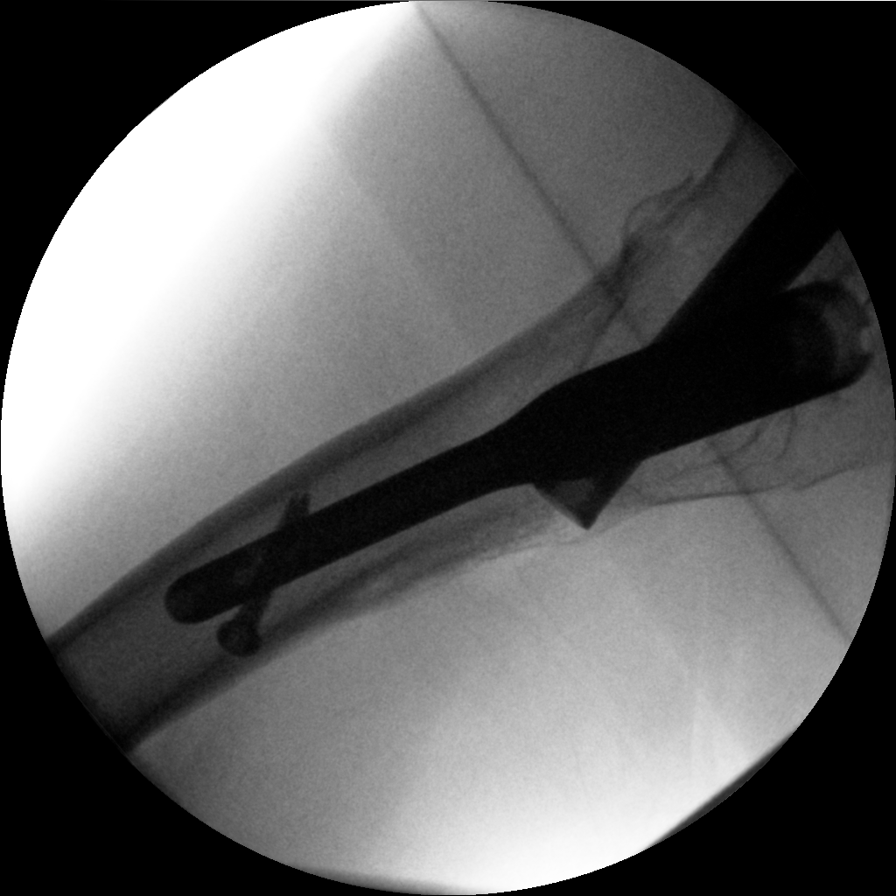

[7 of 7 positions shown; findings below may reference images not displayed]

FINDINGS: Multiple fluoroscopic intraoperative images from intramedullary pin
and screw fixation of right intertrochanteric femoral fracture
demonstrate normal alignment of the orthopedic hardware. No new
fractures are seen.

Fluoroscopy time is recorded as 52 seconds.
IMPRESSION: Fluoroscopic images from open reduction internal fixation of right
intertrochanteric femoral fracture without evidence of immediate
complications.

## 2020-06-15 IMAGING — US US EXTREM LOW VENOUS*R*
1 series · 13 of 24 positions shown · non-contrast
Comparison: None.

CLINICAL DATA: None 52-year-old male with intermittent right lower
extremity pain and edema for the past week



[Series 1: us extrem low venous*right* · 0.06mm/px · 13 of 34 slices shown]
[im 1/34]
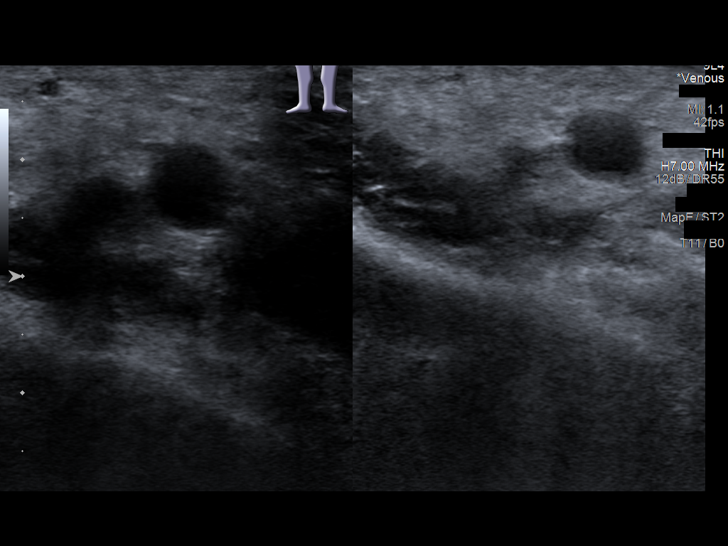
[im 3/34]
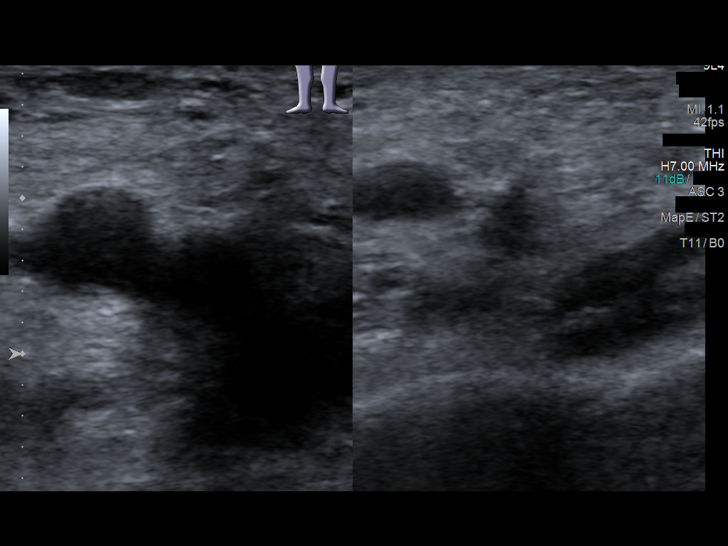
[im 6/34]
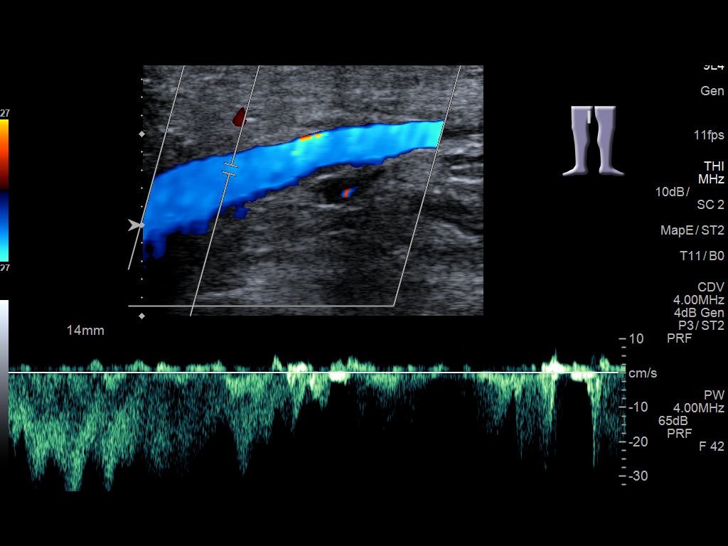
[im 9/34]
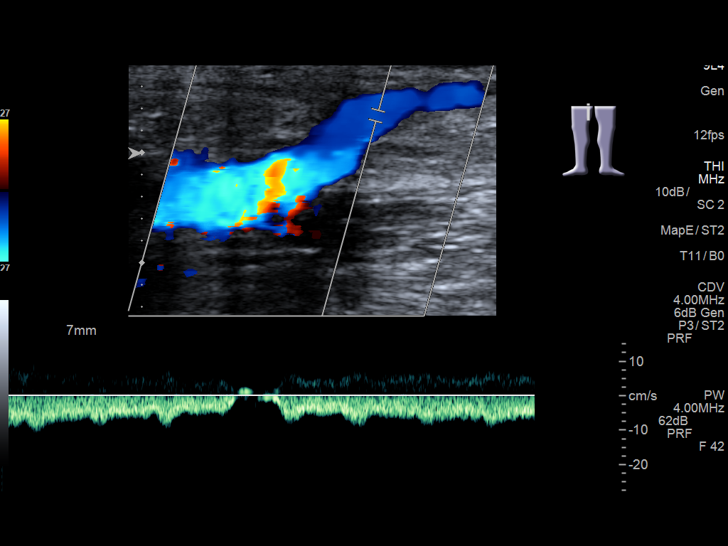
[im 12/34]
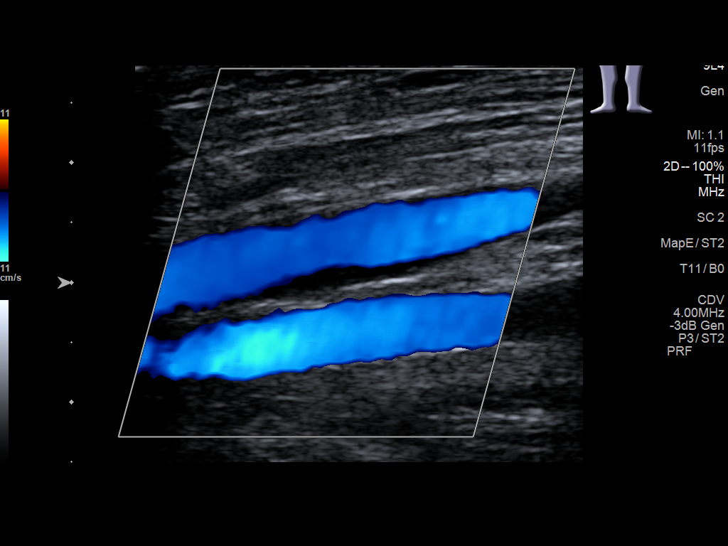
[im 15/34]
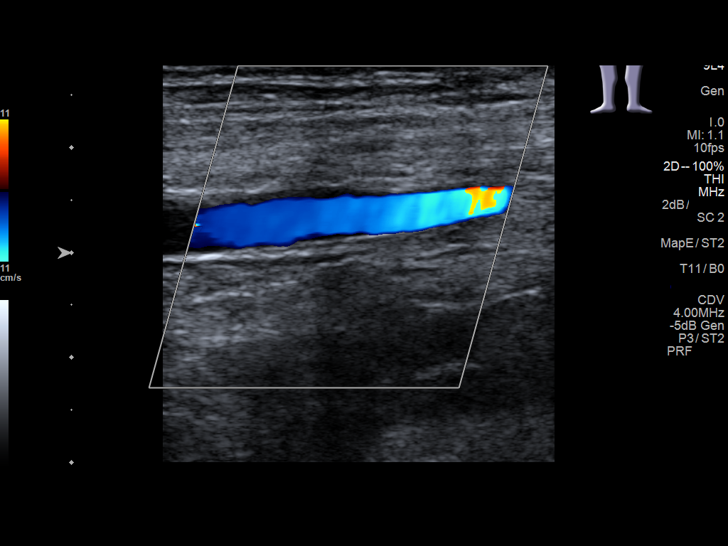
[im 18/34]
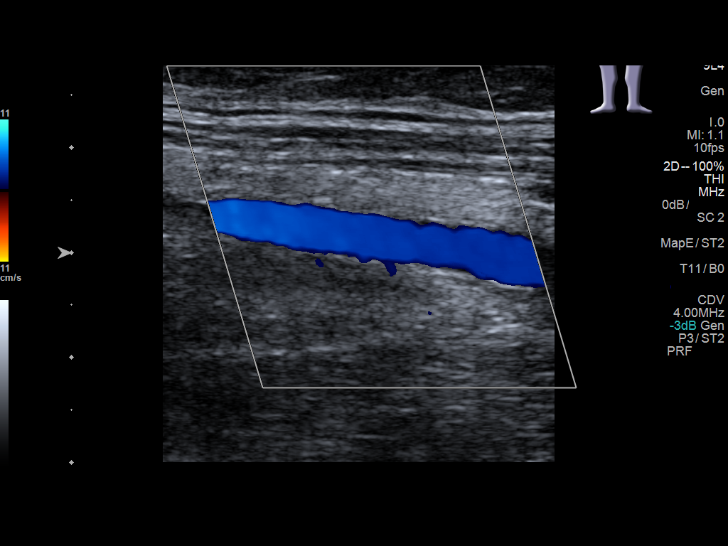
[im 19/34]
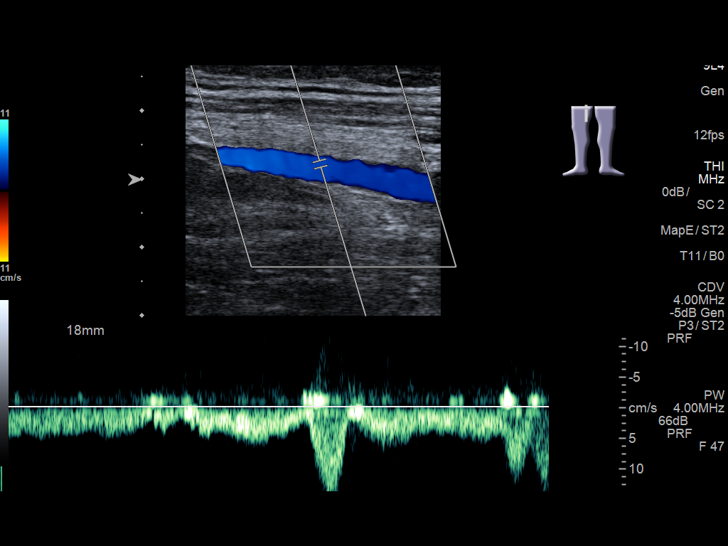
[im 22/34]
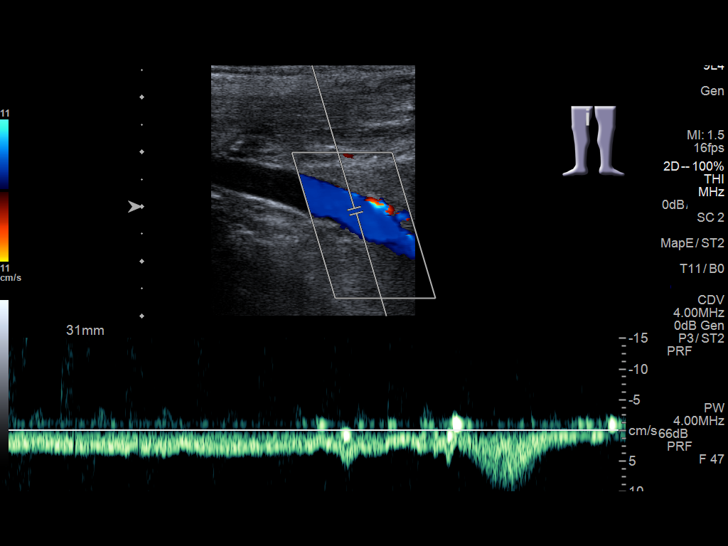
[im 25/34]
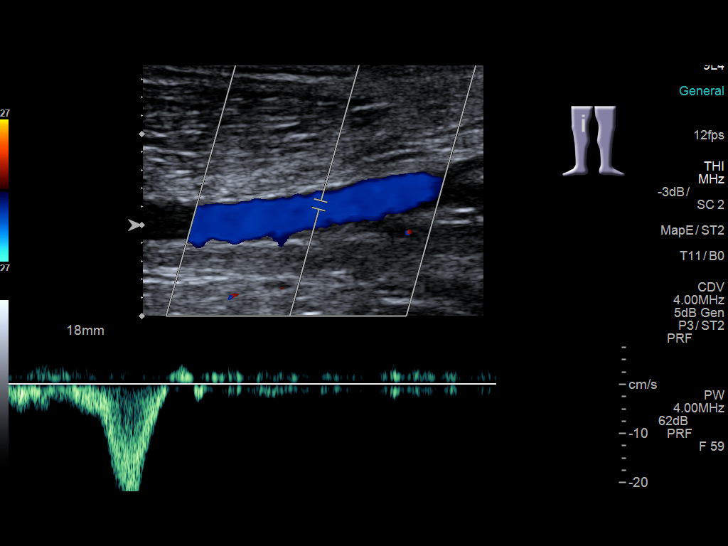
[im 28/34]
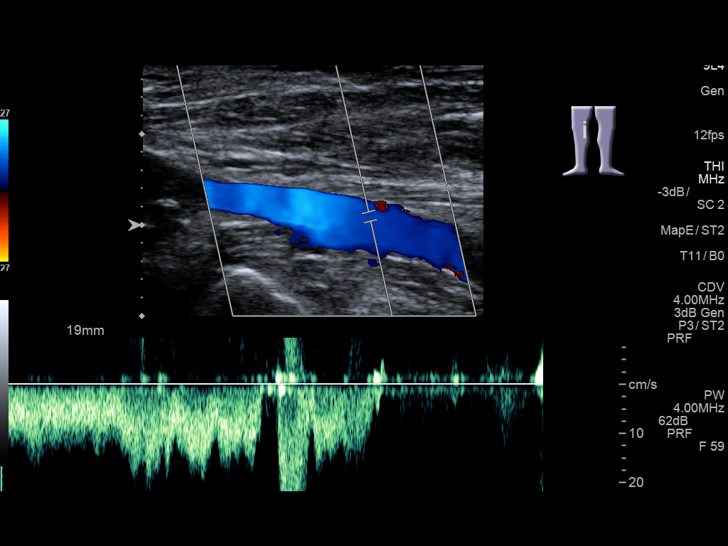
[im 31/34]
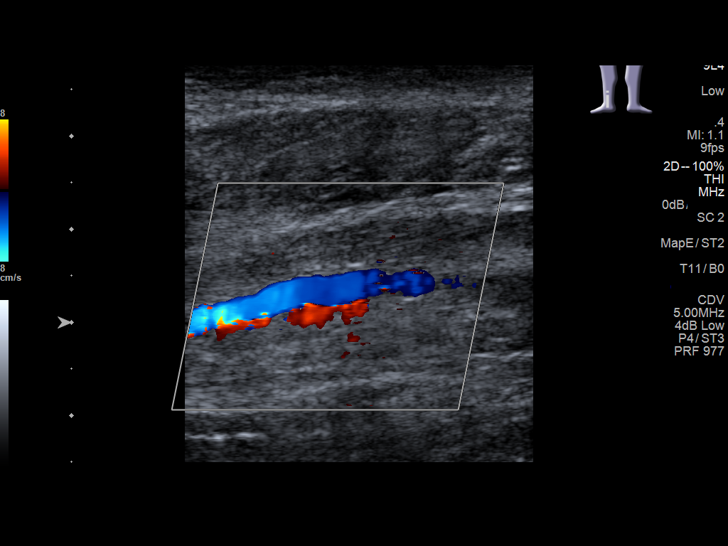
[im 34/34]
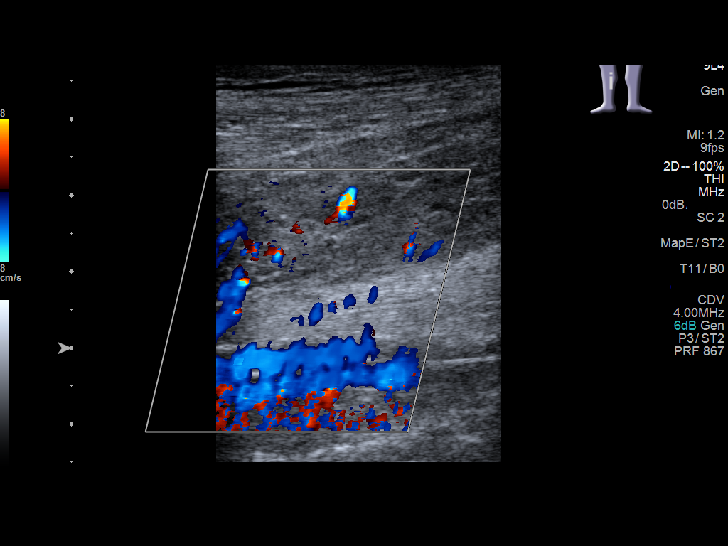

[13 of 24 positions shown; findings below may reference images not displayed]

FINDINGS: Contralateral Common Femoral Vein: Respiratory phasicity is normal
and symmetric with the symptomatic side. No evidence of thrombus.
Normal compressibility.

Common Femoral Vein: No evidence of thrombus. Normal
compressibility, respiratory phasicity and response to augmentation.

Saphenofemoral Junction: No evidence of thrombus. Normal
compressibility and flow on color Doppler imaging.

Profunda Femoral Vein: No evidence of thrombus. Normal
compressibility and flow on color Doppler imaging.

Femoral Vein: No evidence of thrombus. Normal compressibility,
respiratory phasicity and response to augmentation.

Popliteal Vein: No evidence of thrombus. Normal compressibility,
respiratory phasicity and response to augmentation.

Calf Veins: No evidence of thrombus. Normal compressibility and flow
on color Doppler imaging.

Superficial Great Saphenous Vein: No evidence of thrombus. Normal
compressibility.

Venous Reflux:  None.

Other Findings:  None.
IMPRESSION: No evidence of deep venous thrombosis.

## 2021-01-07 DEATH — deceased
